# Patient Record
Sex: Female | Born: 1960 | Race: White | Hispanic: No | Smoking: Current every day smoker
Health system: Southern US, Community
[De-identification: ages and names within clinical notes are randomized; demographics above are authoritative.]

## PROBLEM LIST (undated history)

## (undated) DIAGNOSIS — E039 Hypothyroidism, unspecified: Secondary | ICD-10-CM

## (undated) DIAGNOSIS — F319 Bipolar disorder, unspecified: Secondary | ICD-10-CM

## (undated) DIAGNOSIS — F329 Major depressive disorder, single episode, unspecified: Secondary | ICD-10-CM

## (undated) DIAGNOSIS — F32A Depression, unspecified: Secondary | ICD-10-CM

## (undated) DIAGNOSIS — F419 Anxiety disorder, unspecified: Secondary | ICD-10-CM

## (undated) DIAGNOSIS — Z86010 Personal history of colonic polyps: Secondary | ICD-10-CM

## (undated) DIAGNOSIS — I10 Essential (primary) hypertension: Secondary | ICD-10-CM

## (undated) DIAGNOSIS — M1990 Unspecified osteoarthritis, unspecified site: Secondary | ICD-10-CM

## (undated) DIAGNOSIS — E785 Hyperlipidemia, unspecified: Secondary | ICD-10-CM

## (undated) DIAGNOSIS — J449 Chronic obstructive pulmonary disease, unspecified: Secondary | ICD-10-CM

## (undated) DIAGNOSIS — J45909 Unspecified asthma, uncomplicated: Secondary | ICD-10-CM

## (undated) DIAGNOSIS — K219 Gastro-esophageal reflux disease without esophagitis: Secondary | ICD-10-CM

## (undated) HISTORY — DX: Major depressive disorder, single episode, unspecified: F32.9

## (undated) HISTORY — DX: Personal history of colonic polyps: Z86.010

## (undated) HISTORY — DX: Gastro-esophageal reflux disease without esophagitis: K21.9

## (undated) HISTORY — PX: POLYPECTOMY: SHX149

## (undated) HISTORY — DX: Chronic obstructive pulmonary disease, unspecified: J44.9

## (undated) HISTORY — DX: Essential (primary) hypertension: I10

## (undated) HISTORY — DX: Unspecified osteoarthritis, unspecified site: M19.90

## (undated) HISTORY — PX: COLONOSCOPY: SHX174

## (undated) HISTORY — DX: Anxiety disorder, unspecified: F41.9

## (undated) HISTORY — DX: Bipolar disorder, unspecified: F31.9

## (undated) HISTORY — DX: Hyperlipidemia, unspecified: E78.5

## (undated) HISTORY — DX: Depression, unspecified: F32.A

## (undated) HISTORY — DX: Unspecified asthma, uncomplicated: J45.909

## (undated) HISTORY — DX: Hypothyroidism, unspecified: E03.9

---

## 2000-06-08 ENCOUNTER — Encounter: Payer: Self-pay | Admitting: Emergency Medicine

## 2000-06-08 ENCOUNTER — Encounter: Admission: RE | Admit: 2000-06-08 | Discharge: 2000-06-08 | Payer: Self-pay | Admitting: Emergency Medicine

## 2001-06-10 ENCOUNTER — Encounter: Admission: RE | Admit: 2001-06-10 | Discharge: 2001-06-10 | Payer: Self-pay | Admitting: Emergency Medicine

## 2001-06-10 ENCOUNTER — Encounter: Payer: Self-pay | Admitting: Emergency Medicine

## 2001-06-17 ENCOUNTER — Encounter: Payer: Self-pay | Admitting: Emergency Medicine

## 2001-06-17 ENCOUNTER — Encounter: Admission: RE | Admit: 2001-06-17 | Discharge: 2001-06-17 | Payer: Self-pay | Admitting: Emergency Medicine

## 2002-04-11 ENCOUNTER — Encounter: Payer: Self-pay | Admitting: Family Medicine

## 2002-04-11 ENCOUNTER — Encounter: Admission: RE | Admit: 2002-04-11 | Discharge: 2002-04-11 | Payer: Self-pay | Admitting: Family Medicine

## 2002-07-16 ENCOUNTER — Encounter: Admission: RE | Admit: 2002-07-16 | Discharge: 2002-07-16 | Payer: Self-pay

## 2002-08-25 ENCOUNTER — Encounter: Admission: RE | Admit: 2002-08-25 | Discharge: 2002-08-25 | Payer: Self-pay

## 2002-11-11 ENCOUNTER — Inpatient Hospital Stay (HOSPITAL_COMMUNITY): Admission: EM | Admit: 2002-11-11 | Discharge: 2002-11-14 | Payer: Self-pay | Admitting: Psychiatry

## 2002-11-27 HISTORY — PX: LUMBAR FUSION: SHX111

## 2003-07-20 ENCOUNTER — Encounter: Admission: RE | Admit: 2003-07-20 | Discharge: 2003-07-20 | Payer: Self-pay | Admitting: Emergency Medicine

## 2003-07-20 ENCOUNTER — Encounter: Payer: Self-pay | Admitting: Emergency Medicine

## 2003-12-07 ENCOUNTER — Inpatient Hospital Stay (HOSPITAL_COMMUNITY): Admission: RE | Admit: 2003-12-07 | Discharge: 2003-12-09 | Payer: Self-pay | Admitting: Neurosurgery

## 2004-01-07 ENCOUNTER — Encounter: Admission: RE | Admit: 2004-01-07 | Discharge: 2004-01-07 | Payer: Self-pay | Admitting: Neurosurgery

## 2004-03-10 ENCOUNTER — Encounter: Admission: RE | Admit: 2004-03-10 | Discharge: 2004-03-10 | Payer: Self-pay | Admitting: Neurosurgery

## 2005-10-20 ENCOUNTER — Encounter: Admission: RE | Admit: 2005-10-20 | Discharge: 2005-10-20 | Payer: Self-pay | Admitting: Emergency Medicine

## 2005-11-10 ENCOUNTER — Encounter: Admission: RE | Admit: 2005-11-10 | Discharge: 2005-11-10 | Payer: Self-pay | Admitting: Emergency Medicine

## 2006-05-14 ENCOUNTER — Encounter: Admission: RE | Admit: 2006-05-14 | Discharge: 2006-05-14 | Payer: Self-pay | Admitting: Emergency Medicine

## 2006-10-02 ENCOUNTER — Encounter: Admission: RE | Admit: 2006-10-02 | Discharge: 2006-10-02 | Payer: Self-pay | Admitting: Emergency Medicine

## 2006-10-25 ENCOUNTER — Encounter
Admission: RE | Admit: 2006-10-25 | Discharge: 2007-01-23 | Payer: Self-pay | Admitting: Physical Medicine & Rehabilitation

## 2006-10-25 ENCOUNTER — Ambulatory Visit: Payer: Self-pay | Admitting: Physical Medicine & Rehabilitation

## 2006-10-31 ENCOUNTER — Encounter
Admission: RE | Admit: 2006-10-31 | Discharge: 2007-01-16 | Payer: Self-pay | Admitting: Physical Medicine & Rehabilitation

## 2006-11-13 ENCOUNTER — Encounter: Admission: RE | Admit: 2006-11-13 | Discharge: 2006-11-13 | Payer: Self-pay | Admitting: Emergency Medicine

## 2006-11-22 ENCOUNTER — Encounter: Admission: RE | Admit: 2006-11-22 | Discharge: 2006-11-22 | Payer: Self-pay | Admitting: Emergency Medicine

## 2007-01-17 ENCOUNTER — Encounter
Admission: RE | Admit: 2007-01-17 | Discharge: 2007-04-17 | Payer: Self-pay | Admitting: Physical Medicine & Rehabilitation

## 2007-01-17 ENCOUNTER — Ambulatory Visit: Payer: Self-pay | Admitting: Physical Medicine & Rehabilitation

## 2007-03-04 ENCOUNTER — Ambulatory Visit: Payer: Self-pay | Admitting: Physical Medicine & Rehabilitation

## 2007-04-19 ENCOUNTER — Ambulatory Visit: Payer: Self-pay | Admitting: Physical Medicine & Rehabilitation

## 2007-04-19 ENCOUNTER — Encounter
Admission: RE | Admit: 2007-04-19 | Discharge: 2007-07-18 | Payer: Self-pay | Admitting: Physical Medicine & Rehabilitation

## 2007-06-28 ENCOUNTER — Ambulatory Visit: Payer: Self-pay | Admitting: Physical Medicine & Rehabilitation

## 2007-07-25 ENCOUNTER — Encounter
Admission: RE | Admit: 2007-07-25 | Discharge: 2007-10-23 | Payer: Self-pay | Admitting: Physical Medicine & Rehabilitation

## 2007-08-29 ENCOUNTER — Ambulatory Visit: Payer: Self-pay | Admitting: Physical Medicine & Rehabilitation

## 2007-10-21 ENCOUNTER — Ambulatory Visit: Payer: Self-pay | Admitting: Family Medicine

## 2007-10-21 LAB — CONVERTED CEMR LAB: Microalb, Ur: 1.44 mg/dL (ref 0.00–1.89)

## 2007-10-22 ENCOUNTER — Ambulatory Visit: Payer: Self-pay | Admitting: *Deleted

## 2007-10-22 ENCOUNTER — Encounter
Admission: RE | Admit: 2007-10-22 | Discharge: 2007-10-23 | Payer: Self-pay | Admitting: Physical Medicine & Rehabilitation

## 2007-12-03 ENCOUNTER — Ambulatory Visit: Payer: Self-pay | Admitting: Family Medicine

## 2007-12-03 LAB — CONVERTED CEMR LAB
AST: 35 units/L (ref 0–37)
Albumin: 4.2 g/dL (ref 3.5–5.2)
Alkaline Phosphatase: 63 units/L (ref 39–117)
Basophils Relative: 0 % (ref 0–1)
Glucose, Bld: 102 mg/dL — ABNORMAL HIGH (ref 70–99)
LDL Cholesterol: 117 mg/dL — ABNORMAL HIGH (ref 0–99)
MCHC: 31.8 g/dL (ref 30.0–36.0)
Monocytes Relative: 9 % (ref 3–12)
Neutro Abs: 3.6 10*3/uL (ref 1.7–7.7)
Neutrophils Relative %: 46 % (ref 43–77)
Potassium: 3.9 meq/L (ref 3.5–5.3)
RBC: 4.55 M/uL (ref 3.87–5.11)
Sodium: 139 meq/L (ref 135–145)
TSH: 4.455 microintl units/mL (ref 0.350–5.50)
Total Protein: 7.9 g/dL (ref 6.0–8.3)
Triglycerides: 363 mg/dL — ABNORMAL HIGH (ref <150)
WBC: 7.8 10*3/uL (ref 4.0–10.5)

## 2008-02-13 ENCOUNTER — Encounter (INDEPENDENT_AMBULATORY_CARE_PROVIDER_SITE_OTHER): Payer: Self-pay | Admitting: Family Medicine

## 2008-02-13 ENCOUNTER — Ambulatory Visit: Payer: Self-pay | Admitting: Family Medicine

## 2008-03-24 ENCOUNTER — Ambulatory Visit: Payer: Self-pay | Admitting: Internal Medicine

## 2008-05-18 ENCOUNTER — Ambulatory Visit: Payer: Self-pay | Admitting: Internal Medicine

## 2008-05-18 ENCOUNTER — Encounter (INDEPENDENT_AMBULATORY_CARE_PROVIDER_SITE_OTHER): Payer: Self-pay | Admitting: Family Medicine

## 2008-05-18 LAB — CONVERTED CEMR LAB
Cholesterol: 193 mg/dL (ref 0–200)
HDL: 40 mg/dL (ref >39)
Potassium: 4.2 meq/L (ref 3.5–5.3)
Sodium: 143 meq/L (ref 135–145)
Total CHOL/HDL Ratio: 4.8
Triglycerides: 275 mg/dL — ABNORMAL HIGH (ref <150)
VLDL: 55 mg/dL — ABNORMAL HIGH (ref 0–40)

## 2008-06-22 ENCOUNTER — Ambulatory Visit: Payer: Self-pay | Admitting: Internal Medicine

## 2008-06-22 ENCOUNTER — Encounter: Payer: Self-pay | Admitting: Internal Medicine

## 2008-06-22 LAB — CONVERTED CEMR LAB
Alkaline Phosphatase: 58 units/L (ref 39–117)
BUN: 10 mg/dL (ref 6–23)
Glucose, Bld: 207 mg/dL — ABNORMAL HIGH (ref 70–99)
Total Bilirubin: 0.3 mg/dL (ref 0.3–1.2)

## 2008-08-21 ENCOUNTER — Ambulatory Visit: Payer: Self-pay | Admitting: Family Medicine

## 2008-08-31 ENCOUNTER — Encounter (INDEPENDENT_AMBULATORY_CARE_PROVIDER_SITE_OTHER): Payer: Self-pay | Admitting: Family Medicine

## 2008-08-31 ENCOUNTER — Ambulatory Visit: Payer: Self-pay | Admitting: Internal Medicine

## 2008-08-31 LAB — CONVERTED CEMR LAB
Eosinophils Relative: 2 % (ref 0–5)
Free T4: 0.97 ng/dL (ref 0.89–1.80)
HCT: 46.2 % — ABNORMAL HIGH (ref 36.0–46.0)
Hemoglobin: 15.2 g/dL — ABNORMAL HIGH (ref 12.0–15.0)
Lymphocytes Relative: 42 % (ref 12–46)
Lymphs Abs: 3.2 10*3/uL (ref 0.7–4.0)
Neutro Abs: 3.5 10*3/uL (ref 1.7–7.7)
Platelets: 249 10*3/uL (ref 150–400)
Vit D, 1,25-Dihydroxy: 31 (ref 30–89)
Vitamin B-12: 375 pg/mL (ref 211–911)
WBC: 7.6 10*3/uL (ref 4.0–10.5)

## 2008-11-17 ENCOUNTER — Ambulatory Visit: Payer: Self-pay | Admitting: Family Medicine

## 2008-11-17 LAB — CONVERTED CEMR LAB: Microalb, Ur: 135 mg/dL — ABNORMAL HIGH (ref 0.00–1.89)

## 2008-11-25 ENCOUNTER — Ambulatory Visit: Payer: Self-pay | Admitting: Internal Medicine

## 2008-12-08 ENCOUNTER — Ambulatory Visit: Payer: Self-pay | Admitting: Internal Medicine

## 2008-12-18 ENCOUNTER — Ambulatory Visit: Payer: Self-pay | Admitting: Family Medicine

## 2009-02-18 ENCOUNTER — Ambulatory Visit: Payer: Self-pay | Admitting: Family Medicine

## 2009-02-18 LAB — CONVERTED CEMR LAB
BUN: 14 mg/dL (ref 6–23)
Basophils Absolute: 0 10*3/uL (ref 0.0–0.1)
Basophils Relative: 1 % (ref 0–1)
CO2: 23 meq/L (ref 19–32)
Calcium: 9.5 mg/dL (ref 8.4–10.5)
Chloride: 102 meq/L (ref 96–112)
Cholesterol: 206 mg/dL — ABNORMAL HIGH (ref 0–200)
Creatinine, Ser: 0.98 mg/dL (ref 0.40–1.20)
Eosinophils Absolute: 0.2 10*3/uL (ref 0.0–0.7)
Eosinophils Relative: 3 % (ref 0–5)
Glucose, Bld: 98 mg/dL (ref 70–99)
HCT: 43.2 % (ref 36.0–46.0)
HDL: 32 mg/dL — ABNORMAL LOW (ref >39)
Hemoglobin: 14.4 g/dL (ref 12.0–15.0)
LDL Cholesterol: 104 mg/dL — ABNORMAL HIGH (ref 0–99)
Lymphocytes Relative: 42 % (ref 12–46)
Lymphs Abs: 3 10*3/uL (ref 0.7–4.0)
MCHC: 33.3 g/dL (ref 30.0–36.0)
MCV: 102.4 fL — ABNORMAL HIGH (ref 78.0–100.0)
Monocytes Absolute: 0.7 10*3/uL (ref 0.1–1.0)
Monocytes Relative: 10 % (ref 3–12)
Neutro Abs: 3.2 10*3/uL (ref 1.7–7.7)
Neutrophils Relative %: 44 % (ref 43–77)
Platelets: 212 10*3/uL (ref 150–400)
Potassium: 4 meq/L (ref 3.5–5.3)
RBC: 4.22 M/uL (ref 3.87–5.11)
RDW: 15.9 % — ABNORMAL HIGH (ref 11.5–15.5)
Sodium: 141 meq/L (ref 135–145)
TSH: 5.32 microintl units/mL — ABNORMAL HIGH (ref 0.350–4.500)
Total CHOL/HDL Ratio: 6.4
Triglycerides: 351 mg/dL — ABNORMAL HIGH (ref <150)
VLDL: 70 mg/dL — ABNORMAL HIGH (ref 0–40)
Valproic Acid Lvl: 106.3 ug/mL — ABNORMAL HIGH (ref 50.0–100.0)
WBC: 7.2 10*3/uL (ref 4.0–10.5)

## 2009-05-27 ENCOUNTER — Ambulatory Visit: Payer: Self-pay | Admitting: Internal Medicine

## 2009-10-29 ENCOUNTER — Ambulatory Visit (HOSPITAL_COMMUNITY): Admission: RE | Admit: 2009-10-29 | Discharge: 2009-10-29 | Payer: Self-pay | Admitting: Family Medicine

## 2009-10-29 ENCOUNTER — Ambulatory Visit: Payer: Self-pay | Admitting: Family Medicine

## 2010-04-05 ENCOUNTER — Encounter (INDEPENDENT_AMBULATORY_CARE_PROVIDER_SITE_OTHER): Payer: Self-pay | Admitting: Family Medicine

## 2010-04-05 ENCOUNTER — Ambulatory Visit: Payer: Self-pay | Admitting: Internal Medicine

## 2010-04-05 LAB — CONVERTED CEMR LAB: TSH: 2.618 microintl units/mL (ref 0.350–4.500)

## 2010-05-31 ENCOUNTER — Ambulatory Visit: Payer: Self-pay | Admitting: Family Medicine

## 2010-05-31 LAB — CONVERTED CEMR LAB
Alkaline Phosphatase: 70 units/L (ref 39–117)
Bilirubin, Direct: 0.1 mg/dL (ref 0.0–0.3)
Eosinophils Absolute: 0.2 10*3/uL (ref 0.0–0.7)
Eosinophils Relative: 2 % (ref 0–5)
HCT: 42.5 % (ref 36.0–46.0)
Indirect Bilirubin: 0.2 mg/dL (ref 0.0–0.9)
Lymphocytes Relative: 37 % (ref 12–46)
Lymphs Abs: 3.2 10*3/uL (ref 0.7–4.0)
MCV: 97.5 fL (ref 78.0–100.0)
Monocytes Relative: 8 % (ref 3–12)
Platelets: 259 10*3/uL (ref 150–400)
RBC: 4.36 M/uL (ref 3.87–5.11)
Total Protein: 7.5 g/dL (ref 6.0–8.3)
WBC: 8.7 10*3/uL (ref 4.0–10.5)

## 2010-08-30 ENCOUNTER — Emergency Department (HOSPITAL_COMMUNITY): Admission: EM | Admit: 2010-08-30 | Discharge: 2010-08-30 | Payer: Self-pay | Admitting: Emergency Medicine

## 2010-09-27 ENCOUNTER — Encounter (INDEPENDENT_AMBULATORY_CARE_PROVIDER_SITE_OTHER): Payer: Self-pay | Admitting: Family Medicine

## 2010-09-27 LAB — CONVERTED CEMR LAB
BUN: 11 mg/dL (ref 6–23)
CO2: 24 meq/L (ref 19–32)
Free T4: 0.89 ng/dL (ref 0.80–1.80)
Glucose, Bld: 214 mg/dL — ABNORMAL HIGH (ref 70–99)
Hgb A1c MFr Bld: 8.4 % — ABNORMAL HIGH (ref <5.7)
Potassium: 4 meq/L (ref 3.5–5.3)
Sodium: 137 meq/L (ref 135–145)
TSH: 8.691 microintl units/mL — ABNORMAL HIGH (ref 0.350–4.500)
Total CHOL/HDL Ratio: 7.9

## 2010-11-22 ENCOUNTER — Encounter (INDEPENDENT_AMBULATORY_CARE_PROVIDER_SITE_OTHER): Payer: Self-pay | Admitting: Family Medicine

## 2010-11-22 LAB — CONVERTED CEMR LAB: ALT: 27 units/L (ref 0–35)

## 2010-12-29 ENCOUNTER — Encounter (INDEPENDENT_AMBULATORY_CARE_PROVIDER_SITE_OTHER): Payer: Self-pay | Admitting: Family Medicine

## 2010-12-29 LAB — CONVERTED CEMR LAB
Free T4: 1.04 ng/dL (ref 0.80–1.80)
TSH: 13.517 microintl units/mL — ABNORMAL HIGH (ref 0.350–4.500)

## 2011-02-08 LAB — DIFFERENTIAL
Basophils Absolute: 0 10*3/uL (ref 0.0–0.1)
Eosinophils Relative: 3 % (ref 0–5)
Lymphocytes Relative: 40 % (ref 12–46)
Neutrophils Relative %: 49 % (ref 43–77)

## 2011-02-08 LAB — BASIC METABOLIC PANEL
BUN: 15 mg/dL (ref 6–23)
Calcium: 9.4 mg/dL (ref 8.4–10.5)
Chloride: 95 mEq/L — ABNORMAL LOW (ref 96–112)
Creatinine, Ser: 1.01 mg/dL (ref 0.4–1.2)
GFR calc Af Amer: >60 mL/min (ref >60)
GFR calc non Af Amer: 58 mL/min — ABNORMAL LOW (ref >60)

## 2011-02-08 LAB — POCT CARDIAC MARKERS
Myoglobin, poc: 105 ng/mL (ref 12–200)
Troponin i, poc: <0.05 ng/mL (ref 0.00–0.09)
Troponin i, poc: <0.05 ng/mL (ref 0.00–0.09)

## 2011-02-08 LAB — CBC
MCV: 99 fL (ref 78.0–100.0)
Platelets: 208 10*3/uL (ref 150–400)
RBC: 3.9 MIL/uL (ref 3.87–5.11)
RDW: 14.7 % (ref 11.5–15.5)
WBC: 9 10*3/uL (ref 4.0–10.5)

## 2011-04-11 NOTE — Assessment & Plan Note (Signed)
Becky Camacho is back regarding her low back pain.  I released her to work last  week, much per her request more than my feeling that she could handle  the work.  She lasted 17 hours and the pain returned.  She has been off  for the long weekend and feels better since resting.  Her pain is down  to a 4-5/10 related to the low back.  The pain is described as sharp and  sometimes stabbing.  Pain interferes with general activity, relations  with others, and enjoyment of life on a mild-to-moderate level.   REVIEW OF SYSTEMS:  The patient reports some depression and anxiety  particularly after failing at her return to the job.   SOCIAL HISTORY:  The patient is widowed and without change.   PHYSICAL EXAMINATION:  VITAL SIGNS:  Blood pressure is 128/78, pulse 95,  respiratory rate 17.  She is satting 94% on room air.  GENERAL:  The patient is generally pleasant.  Alert and oriented times  three.  Affect is bright and appropriate.  She remains overweight.  MUSCULOSKELETAL:  Coordination is intact.  Motor function and back exam  essentially are unchanged.  NEURO:  Cognitively, she is within normal limits.  HEART:  Regular.  CHEST:  Clear.  ABDOMEN:  Soft and nontender.   ASSESSMENT:  1. Chronic low back pain associated with degenerative lumbar disk      disease and post laminectomy syndrome.  2. Lumbar facet syndrome.  3. Obesity.  4. Bipolar disorder.   PLAN:  1. 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  She needs to see if they offer any type of      long term disability.  She needs to discuss potential vocational      rehab with them.  I think she is appropriate for sedentary duty.  2. Continue oxycodone for pain as well as regular stretching and      exercise.  She will continue with Mobic for any inflammatory      effects.  3. Can consider further lumbar facet blocks depending on her course,      but she seems to do well once she is  away from her job.  4. I will see her back in three months' time.  I asked her to call me      if she needs any letters or statements determining her ability to      work.      Ranelle Oyster, M.D.  Electronically Signed     ZTS/MedQ  D:  04/23/2007 10:05:58  T:  04/23/2007 10:55:55  Job #:  [id number]   cc:   Oley Balm. Georgina Pillion, M.D.  Fax: [phone]

## 2011-04-11 NOTE — Assessment & Plan Note (Signed)
Becky Camacho is back for her low back pain.  She has been doing quite well  since I last saw her. He pain has been ranging from 1/10 to 2/10.  The  pain occasionally is in the right lower back.  The pain increases with  activities, particularly standing and walking for longer distances.  She  states the pain interferes with her generally activity,  relationships  with others, and enjoyment of life on a mild to moderate level.  She  hopes to get back to work.   The patient remains on oxycodone 5 mg 1 daily to twice a day p.r.n.Marland Kitchen  She uses Mobic daily for pain control.  She does some stretching and  regular walking.  She likes to use stretching, heat, and ice for her  back for pain relief.   REVIEW OF SYSTEMS:  Is notable for occasionally high sugars.  She has  some depression and anxiety but, otherwise, stable.  Other pertinents  and positives listed above.   SOCIAL HISTORY:  The patient is widowed, smokes one pack per day of  cigarettes.  She has been working as a Oncologist previously.   PHYSICAL EXAMINATION:  Blood pressure is 141/71, pulse is 94,  respiratory rate 17.  She is satting 94% on room air.  The patient is  pleasant, in no acute distress.  She is alert and oriented times three.  Affect is bright and appropriate.  The patient's back is minimally  tender to palpation in the lumbar spine.  She has some pain with right  sided facet  maneuvers.  She is able to bend total of 90 degrees with  really no pain today.  She had some tightness in her hamstrings with  straight leg raising today.  Compression test was negative.  Patrick's  test was negative.  Post laminectomy scars noted.  Cognitively, she is  appropriate.  She still remains overweight.  Mood is good.  Heart is  regular.  Chest is clear.  Abdomen is soft, nontender.   ASSESSMENT:  1. Lumbar post laminectomy syndrome.  2. Lumbar facet syndrome L4- L5, and L5-S1, right greater than left.  3. Obesity.  4. Bipolar disorder.   PLAN:  1. Continue with home exercise and stretching program.  2. I release her to go back to work as tolerated.  She needs to be      diligent with her stretching and technique on the job.  She is not      going to be lifting more 9 or 10 pounds, but she lifts frequently      and turns frequently.  She is to sit as much as possible.  I think      a stretch break every 30 minutes to an hour would be very      beneficial for her.  3. Continue Mobic daily.  4. I will see her back in about 3 months time.  She will come back for      refills in about 4 to 6 weeks, depending on use of oxycodone.      Ranelle Oyster, M.D.  Electronically Signed     ZTS/MedQ  D:  04/08/2007 09:47:01  T:  04/08/2007 10:41:06  Job #:  [id number]   cc:   Oley Balm. Georgina Pillion, M.D.  Fax: [phone]

## 2011-04-11 NOTE — Assessment & Plan Note (Signed)
Becky Camacho is back regarding her low back pain. She has been having some  problems as of late with her back. Her neck has been more stiff as well.  She does not recall any exacerbating incident. She did lose her  prescription for her oxycodone, as it [place] of the [place] of [place]  [place]. She rates her pain a 5 to 8 out of 10. She describes it as  aching. Her pain is worse with walking, bending, standing, and general  activities. Her pain interferes with her function and quality of life on  a moderate to severe level. She can walk about 20 minutes without having  to stop.   REVIEW OF SYSTEMS:  Notable for depression and anxiety. Other pertinent  positives are listed above.   SOCIAL HISTORY:  The patient is widowed. She lives with family. She quit  her job and is receiving no benefits through her work, although she  worked there for 25+ years.   PHYSICAL EXAMINATION:  VITAL SIGNS:  Blood pressure 123/68, pulse 95,  respiratory rate 18. She is saturating 95% on room air.  GENERAL:  The patient is pleasant. She is alert and oriented x3. Her  affect is bright and appropriate. Her gait is normal for basic movement.  She had some problems with fine motor and heel to toe movement.  NECK:  Tender with palpation, particularly over the upper trapezius  segments bilaterally with spastic muscle appreciate. She had limited  flexion, extension, and rotation today. Compression test was equivocal,  as was facet maneuvers today.  LOW BACK:  Remained tender to extension and rotation to either side. She  had limited flexion of approximately 20 to 30 degrees before pain set  in. She remains morbidly obese.  HEART:  Regular.  CHEST:  Clear.  ABDOMEN:  Soft and nontender.   ASSESSMENT:  1. Chronic low back pain related to degenerative lumbar disc disease      and post-laminectomy syndrome.  2. Lumbar facet syndrome.  3. Obesity.  4. Bipolar disorder.  5. Cervical myofascial pain.   PLAN:  1.  After informed consent, we injected both upper trapezius areas with      2 cc of 1% Lidocaine. The patient tolerated this well and was      feeling better as she was leaving the office.  2. Continue oxycodone for breakthrough pain. 5 mg, 1 every 8 hours      p.r.n. I wrote her another prescription for November.  3. I gave her samples of Skelaxin for muscle relaxation.  4. I want her to work on physical fitness, Pilates exercises, diet,      etcetera.  5. I will see her back in about four months time. She will see the      nurse clinic back in two months. Consider facet injections in the      future, as she did well with these before.      Ranelle Oyster, M.D.  Electronically Signed     ZTS/MedQ  D:  08/30/2007 15:35:40  T:  08/31/2007 05:59:16  Job #:  [id number]   cc:   Oley Balm. Georgina Pillion, M.D.  Fax: [phone]

## 2011-04-14 NOTE — Op Note (Signed)
NAME:  Becky Camacho, Becky Camacho                          ACCOUNT NO.:  [id number]   MEDICAL RECORD NO.:  [id number]                   PATIENT TYPE:  INP   LOCATION:  3172                                 FACILITY:  [facility]   PHYSICIAN:  Kathaleen Maser. Pool, M.D.                 DATE OF BIRTH:  15-Dec-1960   DATE OF PROCEDURE:  12/07/2003  DATE OF DISCHARGE:                                 OPERATIVE REPORT   PREOPERATIVE DIAGNOSES:  L2-3 degenerative disk disease with instability and  stenosis.   POSTOPERATIVE DIAGNOSES:  L2-3 degenerative disk disease with instability  and stenosis.   OPERATION PERFORMED:  L2-3 decompressive laminectomy with foraminotomies.  L2-3 posterior lumbar interbody fusion utilizing tangent wedges and local  autografting. L2-3 posterolateral fusion utilizing pedicle screw  instrumentation and local autografting.   SURGEON:  Kathaleen Maser. Pool, M.D.   ASSISTANT:  Donalee Citrin, M.D.   ANESTHESIA:  General endotracheal.   INDICATIONS FOR PROCEDURE:  Ms. Pavlak is a 50 year old female with a history  of severe back pain and bilateral lower extremity symptoms failing  conservative management.  Work-up has demonstrated evidence of an anterior  wedge fracture of L3 with some degree of retrolisthesis of L2 on L3 and  evidence of dynamic instability with foraminal collapse and stenosis  bilaterally.  The patient presents now for L2-3 decompression and fusion  with instrumentation. The remainder of her lumbar spine looks quite healthy.   DESCRIPTION OF PROCEDURE:  The patient was taken to the operating room and  placed on the table in the supine position.  After adequate level of  anesthesia was achieved, the patient was positioned prone onto a Wilson  frame and appropriately padded.  The patient's lumbar region was prepped and  draped sterilely.  A 10 blade was used to make a linear skin incision  overlying the L1, 2, and 3 levels.  This was carried down sharply in the  midline.  A  subperiosteal dissection was then performed exposing the lamina  and facet joints of L1, L2 and L3 as well as the transverse processes of L2  and L3.  Deep self-retaining retractor was placed.  Intraoperative  fluoroscopy was used and the levels were confirmed.  Complete laminectomy  was then performed at L2 and using Leksell rongeur, Kerrison rongeurs  and a  high speed drill.  All elements of the lamina were completely removed to the  inferior aspect of L1.  Inferior facetectomies were performed bilaterally at  L2, superior facetectomies were performed bilaterally at L3 and partial  superior laminectomy of L3 was performed bilaterally.  All bone was cleaned  and used in later autografting.  The ligamentum flavum was again elevated  and resected in piecemeal fashion using Kerrison rongeurs carried down to  the thecal sac, the exiting L2 and L3 nerve roots were identified.  Wide  foraminotomies were performed along their course fully relieving the  stenosis.  The epidural venous plexus was coagulated and cut.  Starting  first on the right side at L2-3, the thecal sac and nerve roots were  protected.  The disk space was identified and incised with a 15 blade in  rectangular fashion.  A wide disk space clean out was then achieved using  pituitary rongeurs, upward angled pituitary rongeurs and Epstein curets.  The disk space was then dilated up to 10 mm and a 10 mm distractor was left  in the patient's right side.  Attention was then placed to the left.  The  thecal sac and nerve roots were protected on the left.  Disk space once  again incised with 15 blade in rectangular fashion.  An aggressive  diskectomy was performed on the left side.  Subsequently, a 10 mm tangent  box cutter and 10 mm tangent chisel was used to further prepare the  interspace.  Soft tissue was removed from the interspace.  A 10 x 26 mm  tangent wedge was then impacted into place and recessed approximately 1 mm  from the  posterior cortical margin.  Distractor was removed from the  patient's contralateral side.  Thecal sac and nerve roots were protected on  the contralateral side and the disk space was once again reamed and then cut  with 10 mm tangent instruments.  The disk space was further curettaged.  Morselized autograft was packed in the interspace.  A second 10 x 26 mm  tangent wedge was then impacted into place and recessed approximately 1 mm  from the posterior cortical margin.  Pedicles at L2 and L3 were then  identified using surface landmarks, intraoperative fluoroscopy.  Superficial  bone overlying the pedicles was removed using the high speed drill.  Each  pedicle was then probed using pedicle awl.  Each pedicle awl track was then  probed and found to be solidly within bone.  Each pedicle awl track was then  tapped with a 5.25 mm screw tap.  Each screw tap hole was probed and found  to be solidly within bone.  5.75 x 40 mm spiral 90 D screws were placed  bilaterally at L2 and L3.  All four screws were found to be well positioned  by both direct visual inspection and intraoperative fluoroscopy.  Transverse  processes of L2 and L3 were then decorticated using high speed drill.  The  morselized autograft was packed posterolaterally bilaterally.  A short  segment titanium rod was then contoured and placed over the screw heads at  L2 and L3.  This was then attached to the screws using locking caps.  The  locking caps were then engaged in a sequential fashion with the construct  under compression.  Final images revealed good position of bone graft and  hardware at the proper operative level with normal alignment of the spine.  The wound was then irrigated with antibiotic solution.  Gelfoam was placed  topically for hemostasis which was found to be good.  A medium Hemovac drain  was left in the epidural space.  The wound was then closed in layers with Vicryl sutures.  Steri-Strips and sterile dressing  were applied.  There were  no apparent complications.  The patient tolerated the procedure well and  returned to the recovery room postoperatively.  Henry A. Pool, M.D.    HAP/MEDQ  D:  12/07/2003  T:  12/07/2003  Job:  [id number]

## 2011-04-14 NOTE — Assessment & Plan Note (Signed)
Becky Camacho is back regarding her low back pain.  She feels like she is  gradually improving in regards to her back pain.  She still has pain  after she walks for about 20 minutes in the right central low back just  above the iliac crest.  Physical therapy was helpful.  She still does  not feel like she is ready to go back to work but still has aspirations  returning to her job.  She rates her pain as a 5/10 and describes it as  aching.  Pain interferes with general activity, relations with others  and enjoyment of life on a moderate level.  Pain is usually worse with  walking and standing and relieved somewhat with rest and some times  flexing.  She uses oxycodone for break though pain as well as her Mobic.   REVIEW OF SYSTEMS:  Notable for the above.  Full review is in the health  and history section of the chart.   SOCIAL HISTORY:  The patient is widowed and without significant change.  She works as a Oncologist.   PHYSICAL EXAMINATION:  VITAL SIGNS:  Blood pressure is 154/90, pulse is  107, respiratory rate 16, she is sating 95% on room air.  GENERAL:  The patient is pleasant and in no acute distress.  She is  alert and oriented x3.  Affect is bright and appropriate.  She remains  obese.  Gait is stable.  Coordination was intact.  Reflexes were2+.  Motor function is normal.  She is able to bend to about 90 degrees with  minimal pain.  When we extended her she had some discomfort but more  pain particularly with movements to the right and right-sided facet  maneuvers.  Left-sided symptoms were much less pronounced today.  Compression test was negative.  Patrick's test was negative as was  straight leg testing.  Post laminectomy wound was noted.  Cognitively  she is within normal limits.  Mood was good.   ASSESSMENT:  1. Chronic low back pain with history of lumbar disc disease and L2-L3      discectomy and fusion with laminectomy.  2. Lumbar facet syndrome at L4-L5 possibly L5-S1 right  greater than      left.  3. Obesity.  4. Bipolar disorder.   PLAN:  1. Continue with home exercise program.  The patient needs to focus on      weight loss and further exercise.  2. Refilled oxycodone 5 mg #60.  3. Continue Mobic.  4. Consider facet blocks at L4-L5 and/or L5-S1.  5. Filled out paperwork for work.  We signed her out for 3 months.  I      am still not sure if it is a realistic option for her to return to      her job although she would like to if she can.      Ranelle Oyster, M.D.  Electronically Signed     ZTS/MedQ  D:  01/18/2007 11:35:14  T:  01/18/2007 12:45:23  Job #:  [id number]   cc:   Hayden Rasmussen  Fax: [phone]

## 2011-04-14 NOTE — Discharge Summary (Signed)
NAME:  Becky Camacho, Becky Camacho                          ACCOUNT NO.:  [id number]   MEDICAL RECORD NO.:  [id number]                   PATIENT TYPE:  IPS   LOCATION:  0503                                 FACILITY:  [facility]   PHYSICIAN:  Carolanne Grumbling, M.D.                 DATE OF BIRTH:  1961-02-11   DATE OF ADMISSION:  11/11/2002  DATE OF DISCHARGE:  11/14/2002                                 DISCHARGE SUMMARY   IDENTIFYING INFORMATION:  The patient was a 50 year old female.   INITIAL ASSESSMENT AND DIAGNOSIS:  The patient was admitted to the hospital  because she had been having mood lability with paranoia and thought  disorganization.  She said she had been depressed for about eight years.  Her husband had died in July 28, 2023 of this year and she said since that time,  she had been having trouble pulling things together.  She states she also  had a brother who committed suicide in Apr 27, 1999 and a second brother who died in  04-27-1999.  By the time of admission, she was sleeping more than usual, being  withdrawn, had gained 30 pounds in six months, thoughts were racing.  She  was talking fast and not able to organize her thoughts.  She had some  suicidal thoughts the weekend prior to Thanksgiving but by the time of this  admission, she was denying any suicidal thoughts.   MENTAL STATUS EXAM:  Mental status at the time of the initial evaluation  revealed a woman with a labile affect and pressured speech.  She was  intrusive.  She acted childlike at times in mannerisms and voice.  Speech  was rapid and mildly pressured.  She did express some paranoid thinking.  She seemed somewhat guarded, particularly around other people and talking  about she handled them.  She had flight of ideas and tangentiality.  She was  oriented x 3.  She recognized that her moods were out of control.  Intelligence seemed to be at least average.  Judgment was impaired.   ADMISSION DIAGNOSES:   AXIS I:  1. Major depressive disorder,  recurrent, severe with psychosis.  2. Rule out bipolar I disorder, mixed state.   AXIS II:  Deferred.   AXIS III:  1. Hypertension.  2. Hypothyroidism.   AXIS IV:  Moderate to severe.   AXIS V:  16/59   FINDINGS:  All indicated laboratory examinations were within normal limits  except for a very slightly elevated T3 uptake, which was reported as 38.9  with a normal high being 37.   HOSPITAL COURSE:  While in the hospital, the patient was basically  talkative.  She did attend groups.  She did believe that she got some help  in talking about her issues.  She also believed that the medications were  the major help to herself because she found herself calming down fairly  quickly.  She  remained fairly labile up until the day before discharge.  By  the day of discharge, she was more calm, moods were more stable.  She  consistently denied any suicidal thoughts and did not want to be in the  hospital.  She was, by the time of discharge, rational.  She was not longer  acting childlike, though she would remain fairly histrionic and dramatic.  She wanted to live with her mother.  Her mother was agreeable to having her  home and consequently, she was discharged to her mother's home.   DISCHARGE MEDICATIONS:  1. Diovan 80 mg daily.  2. Hydrochlorothiazide 25 mg daily.  3. Synthroid 100 mcg daily.  4. Trazodone 50 mg at bedtime as needed.  5. Flovent two puffs daily.  6. Alprazolam 0.25 mg twice daily as needed.  7. Risperdal 0.5 mg at bedtime.  8. Lexapro 20 mg daily.   DISCHARGE INSTRUCTIONS:  There were no restrictions placed on her diet or  her activity.   POST HOSPITAL CARE PLAN:  She was to follow up with Milford Cage, M.D.,  with an appointment on December 23.   FINAL DIAGNOSES:   AXIS I:  Bipolar disorder, not otherwise specified.   AXIS II:  Deferred.   AXIS III:  1. Hypertension.  2. Hypothyroidism.   AXIS IV:  Moderate to severe.   AXIS V:  53/66                                                Carolanne Grumbling, M.D.    GT/MEDQ  D:  11/24/2002  T:  11/24/2002  Job:  [id number]

## 2011-04-14 NOTE — H&P (Signed)
NAME:  Becky Camacho, Becky Camacho NO.:  [id number]   MEDICAL RECORD NO.:  [id number]                   PATIENT TYPE:  IPS   LOCATION:  0503                                 FACILITY:  [facility]   PHYSICIAN:  Geoffery Lyons, M.D.                   DATE OF BIRTH:  1961-01-20   DATE OF ADMISSION:  11/11/2002  DATE OF DISCHARGE:                         PSYCHIATRIC ADMISSION ASSESSMENT   IDENTIFYING INFORMATION:  This is a 50 year old white female who is widowed,  voluntary admission.   HISTORY OF PRESENT ILLNESS:  This patient was referred by her psychiatrist  after she presented in the office with mood lability, paranoia, and thought  disorganization.  The patient reports that she has been fighting depression  for 8 years or so.  The patient had been previously managed by her primary  care physician who had treated her with Prozac for many years.  He switched  her to Lexapro about the time her husband was diagnosed with cancer in  12-Jul-2023.  The patient's husband died somewhat suddenly within 3 weeks of  being diagnosed with cancer.  The patient reports that she has been  struggling with depression since his death and most recently had Xanax 0.5  mg 1/2 to 1 tab b.i.d. added to her medications.  The patient reports that  in addition to her husband dying suddenly in 2023/07/12, she had a brother who  committed suicide in 04/11/1999 and a second brother who also died in 11-Apr-1999.  The  patient reports herself being hypersomic and withdrawn, not wanting to do  anything but lie in bed and sleep.  She has had a 30 pound weight loss in  the past 6 months, with poor appetite, finds that her thoughts are racing,  and finds that she realizes that is frequently talking fast and unable to  organize her thoughts.  She recognizes somewhat that she is hyperverbal and  is unable to stop doing this.  Her family had contacted the patient's  physician and complained of her sleeping a lot and not tending to  her  activities of daily living.  Her psychiatrist yesterday noted that she also  had some regression of her behavior.  The patient reports suicidal thoughts  coming to her intermittently several times a week since the weekend before  Thanksgiving, at which time she attempted to drown herself by walking into  the ocean.  She turned around and walked back out of the water and never  completed the attempt.  Today the patient does endorse some suicidal  thoughts, no homicidal ideation.  She endorses depressed mood, poor appetite  and difficulty sleeping at night, although she spends much time sleeping and  withdrawn during the day.   PAST PSYCHIATRIC HISTORY:  The patient is followed by Dr. Milford Cage,  last seen on yesterday and on December 5.  She has been following her for  the past 1-2 months.  This is her first inpatient psychiatric admission.  She has no prior inpatient admissions.  She was previously treated by her  primary care physician, who is Dr. Georgina Pillion.  The patient has a history of one  prior suicide attempt in November of this same year, referenced above, for  which she was not treated.   SOCIAL HISTORY:  This is a 50 year old white female who is employed at [employer] at the [employer].  She has been unable to work.  She was married  22 years until her husband died on 08/17/243 weeks after being diagnosed  with cancer.  The patient is currently unemployed.  She has also had an  acute episode of bronchitis approximately 1 month ago which she considers to  be an additional stressor.   FAMILY HISTORY:  Remarkable for a brother of the patient who died by  suicide.   ALCOHOL AND DRUG HISTORY:  The patient denies any history of substance  abuse.   PAST MEDICAL HISTORY:  The patient is followed by Dr. Georgina Pillion.  Medical  problems are hypertension and hypothyroidism.   MEDICATIONS:  Diovan 80/12.5 mg p.o. daily, Xanax 0.5 mg 1/2 to 1 tab  b.i.d., hydrochlorothiazide  25 mg p.o. q.d., Lexapro recently increased to  25 mg on October 31, 2002, Synthroid 100 mcg.   DRUG ALLERGIES:  PENICILLIN, which causes a rash, and CODEINE, which she  states has caused her to hallucinate and she reports makes her rage.   POSITIVE PHYSICAL FINDINGS:  The patient's physical examination was quite  limited today due to her intrusive behavior and her labile affect.  She is  an overweight female who is in no acute distress.  Vital signs on admission  to the unit:  Temperature 98.1, pulse 77, respirations 16, blood pressure  126/83.  She weighed 188 pounds and is 5 feet 2 inches tall.  No  particularly remarkable features to her skin.  Her grooming is satisfactory.  CARDIOVASCULAR:  Heart sounds normal with regular rate and rhythm.  S1 and  S2 heard, no clicks, murmurs, or gallops.  No evidence of pedal edema.  Extremities are pink and warm.  Distal pulses are 2+/5, synchronous with  radial pulse.  LUNGS:  Clear to auscultation.  NEURO:  Generally unremarkable.  Her EOMs are intact.  Ocular tracking is  normal.  Facial symmetry is present.  Motor movements are smooth without  tremor.  Her gait is grossly normal, with normal arm swing.   LABORATORY DATA:  The patient's labs are currently pending.   MENTAL STATUS EXAM:  This is an overweight middle-aged female with a labile  affect and pressured speech. She is intrusive and suddenly puts her face up  within 1 inch of mine.  She intermittently regresses to a child-like state  with affected mannerisms and a child-like voice.  Her speech is rapid in  nature and mildly pressured.  Her mood is depressed but labile.  Thought  process is remarkable for some paranoia.  She feels convinced that her  psychiatrist is against her and is somewhat unreasonable in her anger and  annoyance that her psychiatrist would refer her for admission.  She has been somewhat guarded around the other patients and had difficulty tolerating  group  earlier today.  She is also remarkable for flight of ideas and  tangentiality.  Cognitively, she is intact and oriented x3.  The patient has  adequate insight to recognize  that her mood is somewhat out of control and  that she is hyperverbal and so she has some insight into her symptoms.  Her  intelligence is above average, insight fairly good, judgment impaired,  impulse control questionable.   ADMISSION DIAGNOSIS:   AXIS I:  1. Major depressive disorder, recurrent, severe, with psychosis.  2. Rule out bipolar I disorder, mixed state.   AXIS II:  Deferred.   AXIS III:  Hypertension, hypothyroidism.   AXIS IV:  Moderate to severe, grief over death of spouse.   AXIS V:  Current 16, past year 11.   INITIAL PLAN OF CARE:  Voluntarily admit the patient with q.15 minute checks  in place.  Our goal is to alleviate her mood lability.  We will start by  decreasing her Lexapro to 20 mg p.o. daily.  The patient does feel that her  symptoms had become somewhat worse since her Lexapro was increased to 25 mg  a day.  We are going to start her on Risperdal 0.25 mg p.o. b.i.d. and 0.5  mg at h.s. to alleviate her paranoia and her psychotic symptoms, and  hopefully to impact her regressive behavior.  We have elected to avoid  dosing Zyprexa at this time because of her history of weight gain and we  will stay away from Seroquel because of her prior concerns about being  hypersomic during the day.  Meanwhile, we will continue her other routine  medications.  We have discussed the risks and benefits of this program and  advised her about any potential side effects and she is agreeable to it.   ESTIMATED LENGTH OF STAY:  Five days.  \     Margaret A. Scott, N.P.                   Geoffery Lyons, M.D.    MAS/MEDQ  D:  11/13/2002  T:  11/13/2002  Job:  [id number]

## 2011-04-14 NOTE — Assessment & Plan Note (Signed)
Becky Camacho is back regarding her low back pain.  We started her on Mobic,  which has helped it and she has found oxycodone beneficial for break-  through pain.  Therapy has been very helpful as well.  She has improved  range of motion, decreased pain overall.  She describes her pain more  intermittent than anything else.  But therapy also documents increased  endurance.  The patient rates her pain of 4/10 described as burning,  sometimes sharp, involving the right low back area.  Pain interferes  with general activity, relations with others and enjoyment of life on a  moderate level.  Pain seems to be worse in the evening hours.   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  She  would like to return working there if she can.  The patient smokes a  pack of cigarettes per day.  Family remains supportive.   REVIEW OF SYSTEMS:  Negative for any new issues, other than those  mentioned above.  Full reviews in the health and history section.   PHYSICAL EXAMINATION:  Blood pressure 149/90, pulse is 102, respiratory  rate 16, she is satting 97% on room air. The patient is pleasant and no  acute distress.  She is alert and oriented x3.  Affects bright and  appropriate.  Gait is stable.  Coordination is fair.  Reflexes are at  2+.  Sensation is normal.  Patient's range of motion in the lumbar spine  is much improved with 80-90 degrees of full flexion.  Extension caused  some pain, particularly to the left with left facet maneuvers.  Right  facet maneuvers were positive as well.  Patrick's test was equivocal.  Compression test was negative.  Patient remains overweight.  Postlaminectomy scars noted.  Cognitively she is intact.  Mood is  appropriate.   ASSESSMENT:  1. Chronic low back pain with history of lumbar disk disease and L2,      L3 diskectomy and fusion with laminectomy.  2. Lumbar facet disease.  3. Obesity.  4. Bipolar disorder.   PLAN:  1. Continue conservative  treatment.  She is making nice gains with      physical therapy.  They will continue for the next 3-4 weeks' time      to improve upon range of motion and endurance.  She needs to      continue with these exercises once home as well.  2. Continue Mobic 15 mg daily with food.  3. Continue oxycodone for break-through pain 5 mg q. 8 hours p.r.n.  4. Could still consider facet blocks in the future pending course of      improvement.  5. Wrote the patient out of work for the next few months time.  It may      not wise for her to return to this type and intensity of work      considering her back history, as she is very likely to exacerbate      the problem once again after      she returns.  6. I will see the patient back in about 2 months' time.      Ranelle Oyster, M.D.  Electronically Signed     ZTS/MedQ  D:  11/23/2006 15:33:23  T:  11/23/2006 16:41:33  Job #:  [id number]   cc:   Hayden Rasmussen  Fax: [phone]

## 2011-12-12 ENCOUNTER — Other Ambulatory Visit (HOSPITAL_COMMUNITY): Payer: Self-pay | Admitting: Family Medicine

## 2011-12-12 ENCOUNTER — Other Ambulatory Visit: Payer: Self-pay | Admitting: Family Medicine

## 2011-12-12 ENCOUNTER — Other Ambulatory Visit (HOSPITAL_COMMUNITY)
Admission: RE | Admit: 2011-12-12 | Discharge: 2011-12-12 | Disposition: A | Discharge status: Home / Self Care | Payer: Medicare Other | Source: Ambulatory Visit | Attending: Family Medicine | Admitting: Family Medicine

## 2011-12-12 DIAGNOSIS — Z124 Encounter for screening for malignant neoplasm of cervix: Secondary | ICD-10-CM | POA: Insufficient documentation

## 2011-12-12 DIAGNOSIS — R922 Inconclusive mammogram: Secondary | ICD-10-CM

## 2011-12-12 DIAGNOSIS — N63 Unspecified lump in unspecified breast: Secondary | ICD-10-CM

## 2011-12-12 DIAGNOSIS — Z1231 Encounter for screening mammogram for malignant neoplasm of breast: Secondary | ICD-10-CM

## 2011-12-13 ENCOUNTER — Encounter: Payer: Self-pay | Admitting: Internal Medicine

## 2011-12-20 ENCOUNTER — Ambulatory Visit
Admission: RE | Admit: 2011-12-20 | Discharge: 2011-12-20 | Disposition: A | Discharge status: Home / Self Care | Payer: Medicare Other | Source: Ambulatory Visit | Attending: Family Medicine | Admitting: Family Medicine

## 2011-12-20 DIAGNOSIS — N63 Unspecified lump in unspecified breast: Secondary | ICD-10-CM

## 2011-12-20 DIAGNOSIS — R922 Inconclusive mammogram: Secondary | ICD-10-CM

## 2011-12-25 ENCOUNTER — Ambulatory Visit (AMBULATORY_SURGERY_CENTER): Payer: Medicare Other | Admitting: *Deleted

## 2011-12-25 VITALS — Ht 64.0 in | Wt 236.3 lb

## 2011-12-25 DIAGNOSIS — Z1211 Encounter for screening for malignant neoplasm of colon: Secondary | ICD-10-CM

## 2011-12-25 MED ORDER — PEG-KCL-NACL-NASULF-NA ASC-C 100 G PO SOLR: ORAL | Status: DC

## 2012-01-08 ENCOUNTER — Other Ambulatory Visit: Payer: Self-pay | Admitting: Internal Medicine

## 2012-02-08 ENCOUNTER — Encounter: Payer: Self-pay | Admitting: Internal Medicine

## 2012-02-08 ENCOUNTER — Ambulatory Visit (AMBULATORY_SURGERY_CENTER): Payer: Medicare Other | Admitting: Internal Medicine

## 2012-02-08 VITALS — BP 109/60 | HR 94 | Temp 97.5°F | Resp 20 | Ht 64.0 in | Wt 236.0 lb

## 2012-02-08 DIAGNOSIS — D126 Benign neoplasm of colon, unspecified: Secondary | ICD-10-CM

## 2012-02-08 DIAGNOSIS — D129 Benign neoplasm of anus and anal canal: Secondary | ICD-10-CM

## 2012-02-08 DIAGNOSIS — D128 Benign neoplasm of rectum: Secondary | ICD-10-CM

## 2012-02-08 DIAGNOSIS — Z1211 Encounter for screening for malignant neoplasm of colon: Secondary | ICD-10-CM

## 2012-02-08 DIAGNOSIS — K648 Other hemorrhoids: Secondary | ICD-10-CM

## 2012-02-08 LAB — GLUCOSE, CAPILLARY: Glucose-Capillary: 254 mg/dL — ABNORMAL HIGH (ref 70–99)

## 2012-02-08 MED ORDER — SODIUM CHLORIDE 0.9 % IV SOLN: 500.0000 mL | INTRAVENOUS | Status: DC

## 2012-02-08 NOTE — Op Note (Addendum)
[facility] [address] [place], [place]  [address]  COLONOSCOPY PROCEDURE REPORT  PATIENT:  Becky Camacho, Becky Camacho  MR#:  [id number] BIRTHDATE:  10-04-1961, 50 yrs. old  GENDER:  female ENDOSCOPIST:  Iva Boop, MD, [facility] REF. BY:          Norberto Sorenson, MD ([facility]) PROCEDURE DATE:  02/08/2012 PROCEDURE:  Colonoscopy with biopsy and snare polypectomy ASA CLASS:  Class III INDICATIONS:  Routine Risk Screening MEDICATIONS:   These medications were titrated to patient response per physician's verbal order, MAC sedation, administered by CRNA, propofol (Diprivan) 280 mg IV  DESCRIPTION OF PROCEDURE:   After the risks benefits and alternatives of the procedure were thoroughly explained, informed consent was obtained.  Digital rectal exam was performed and revealed no abnormalities.   The LB 180AL [id number] endoscope was introduced through the anus and advanced to the cecum, which was identified by both the appendix and ileocecal valve, without limitations.  The quality of the prep was Miralax fair.  The instrument was then slowly withdrawn as the colon was fully examined. <<PROCEDUREIMAGES>>  FINDINGS:  There were multiple polyps identified and removed. in the left colon. 10 polyps descending to rectum. Cold snare and biopsy removal. 35mm-8mm in size. All sent to pathology.  This was otherwise a normal examination of the colon. The prep was fair in right colon and adequate at best in left colon. After irrigation. Adherent stool in right colon. The colon was also redundant. Retroflexed views in the rectum revealed internal hemorrhoids. The time to cecum = 5:40 minutes. The scope was then withdrawn in 30:45 minutes from the cecum and the procedure completed. COMPLICATIONS:  None ENDOSCOPIC IMPRESSION: 1) Polyps, multiple in the left colon - 10 removed, max 8 mm 2) Internal hemorrhoids 3) Otherwise normal examination 4) Fair prep RECOMMENDATIONS: repeat colonoscopy within 1 year  likely with MAC and more aggressive prep  Iva Boop, MD, [facility]  CC:  The Patient Addendum also copy Dr. Norberto Sorenson ([facility])  n. REVISED:  02/08/2012 10:28 AM eSIGNED:   Iva Boop at 02/08/2012 10:28 AM  Renold Don, [id number]

## 2012-02-08 NOTE — Progress Notes (Signed)
Patient did not have preoperative order for IV antibiotic SSI prophylaxis. (G8918)  Patient did not experience any of the following events: a burn prior to discharge; a fall within the facility; wrong site/side/patient/procedure/implant event; or a hospital transfer or hospital admission upon discharge from the facility. (G8907)  

## 2012-02-08 NOTE — Patient Instructions (Addendum)
10 polyps were removed today. The colon preparation was only fair to adequate which limited the exam some. I most likely plan to recommend a repeat colonoscopy within 1 year - will let you know after I get pathology results. Becky Boop, MD, FACG YOU HAD AN ENDOSCOPIC PROCEDURE TODAY AT THE [facility]: Refer to the procedure report that was given to you for any specific questions about what was found during the examination.  If the procedure report does not answer your questions, please call your gastroenterologist to clarify.  If you requested that your care partner not be given the details of your procedure findings, then the procedure report has been included in a sealed envelope for you to review at your convenience later.  YOU SHOULD EXPECT: Some feelings of bloating in the abdomen. Passage of more gas than usual.  Walking can help get rid of the air that was put into your GI tract during the procedure and reduce the bloating. If you had a lower endoscopy (such as a colonoscopy or flexible sigmoidoscopy) you may notice spotting of blood in your stool or on the toilet paper. If you underwent a bowel prep for your procedure, then you may not have a normal bowel movement for a few days.  DIET: Your first meal following the procedure should be a light meal and then it is ok to progress to your normal diet.  A half-sandwich or bowl of soup is an example of a good first meal.  Heavy or fried foods are harder to digest and may make you feel nauseous or bloated.  Likewise meals heavy in dairy and vegetables can cause extra gas to form and this can also increase the bloating.  Drink plenty of fluids but you should avoid alcoholic beverages for 24 hours.  ACTIVITY: Your care partner should take you home directly after the procedure.  You should plan to take it easy, moving slowly for the rest of the day.  You can resume normal activity the day after the procedure however you should NOT DRIVE or  use heavy machinery for 24 hours (because of the sedation medicines used during the test).    SYMPTOMS TO REPORT IMMEDIATELY: A gastroenterologist can be reached at any hour.  During normal business hours, 8:30 AM to 5:00 PM Monday through Friday, call [phone].  After hours and on weekends, please call the GI answering service at [phone] who will take a message and have the physician on call contact you.   Following lower endoscopy (colonoscopy or flexible sigmoidoscopy):  Excessive amounts of blood in the stool  Significant tenderness or worsening of abdominal pains  Swelling of the abdomen that is new, acute  Fever of 100F or higher FOLLOW UP: If any biopsies were taken you will be contacted by phone or by letter within the next 1-3 weeks.  Call your gastroenterologist if you have not heard about the biopsies in 3 weeks.  Our staff will call the home number listed on your records the next business day following your procedure to check on you and address any questions or concerns that you may have at that time regarding the information given to you following your procedure. This is a courtesy call and so if there is no answer at the home number and we have not heard from you through the emergency physician on call, we will assume that you have returned to your regular daily activities without incident.  SIGNATURES/CONFIDENTIALITY: You and/or your care  partner have signed paperwork which will be entered into your electronic medical record.  These signatures attest to the fact that that the information above on your After Visit Summary has been reviewed and is understood.  Full responsibility of the confidentiality of this discharge information lies with you and/or your care-partner.

## 2012-02-09 ENCOUNTER — Telehealth: Payer: Self-pay | Admitting: *Deleted

## 2012-02-09 LAB — GLUCOSE, CAPILLARY: Glucose-Capillary: 193 mg/dL — ABNORMAL HIGH (ref 70–99)

## 2012-02-09 NOTE — Telephone Encounter (Signed)
  Follow up Call-  Call back number 02/08/2012  Post procedure Call Back phone  # [phone]  Permission to leave phone message Yes     Patient questions:  Do you have a fever, pain , or abdominal swelling? no Pain Score  0 *  Have you tolerated food without any problems? yes  Have you been able to return to your normal activities? yes  Do you have any questions about your discharge instructions: Diet   no Medications  no Follow up visit  no  Do you have questions or concerns about your Care? no  Actions: * If pain score is 4 or above: No action needed, pain <4.

## 2012-02-14 ENCOUNTER — Encounter: Payer: Self-pay | Admitting: Internal Medicine

## 2012-02-14 DIAGNOSIS — Z860100 Personal history of colon polyps, unspecified: Secondary | ICD-10-CM | POA: Insufficient documentation

## 2012-02-14 HISTORY — DX: Personal history of colonic polyps: Z86.010

## 2012-02-14 NOTE — Progress Notes (Signed)
Quick Note:  10 serrated adenomas -   Office - call patient and arrange for repeat colonoscopy - propofol and 2 day prep needed to be done by June - her prep was not good enough and need to repeat the colonoscopy in this time frame  [facility] - no letter but place a colon recall for July as a back-up ______

## 2012-04-05 ENCOUNTER — Ambulatory Visit (AMBULATORY_SURGERY_CENTER): Payer: Medicare Other

## 2012-04-05 ENCOUNTER — Encounter: Payer: Medicare Other | Admitting: Internal Medicine

## 2012-04-05 VITALS — Ht 62.0 in | Wt 229.9 lb

## 2012-04-05 DIAGNOSIS — Z860100 Personal history of colon polyps, unspecified: Secondary | ICD-10-CM

## 2012-04-05 DIAGNOSIS — Z1211 Encounter for screening for malignant neoplasm of colon: Secondary | ICD-10-CM

## 2012-04-05 DIAGNOSIS — Z800 Family history of malignant neoplasm of digestive organs: Secondary | ICD-10-CM

## 2012-04-05 MED ORDER — PEG-KCL-NACL-NASULF-NA ASC-C 100 G PO SOLR: 1.0000 | Freq: Once | ORAL | Status: AC

## 2012-04-16 ENCOUNTER — Other Ambulatory Visit: Payer: Medicare Other | Admitting: Internal Medicine

## 2012-04-23 ENCOUNTER — Other Ambulatory Visit: Payer: Medicare Other | Admitting: Internal Medicine

## 2012-05-02 ENCOUNTER — Encounter: Payer: Self-pay | Admitting: Internal Medicine

## 2012-05-02 ENCOUNTER — Ambulatory Visit (AMBULATORY_SURGERY_CENTER): Payer: Medicare Other | Admitting: Internal Medicine

## 2012-05-02 VITALS — BP 114/74 | HR 96 | Temp 95.1°F | Resp 18 | Ht 62.0 in | Wt 229.0 lb

## 2012-05-02 DIAGNOSIS — Z86010 Personal history of colonic polyps: Secondary | ICD-10-CM

## 2012-05-02 DIAGNOSIS — D126 Benign neoplasm of colon, unspecified: Secondary | ICD-10-CM

## 2012-05-02 DIAGNOSIS — K635 Polyp of colon: Secondary | ICD-10-CM

## 2012-05-02 DIAGNOSIS — Z1211 Encounter for screening for malignant neoplasm of colon: Secondary | ICD-10-CM

## 2012-05-02 LAB — GLUCOSE, CAPILLARY

## 2012-05-02 MED ORDER — SODIUM CHLORIDE 0.9 % IV SOLN: 500.0000 mL | INTRAVENOUS | Status: DC

## 2012-05-02 NOTE — Progress Notes (Signed)
Patient did not experience any of the following events: a burn prior to discharge; a fall within the facility; wrong site/side/patient/procedure/implant event; or a hospital transfer or hospital admission upon discharge from the facility. (G8907) Patient did not have preoperative order for IV antibiotic SSI prophylaxis. (G8918)  

## 2012-05-02 NOTE — Patient Instructions (Signed)
Discharge instructions given with verbal understanding. Handouts on polyps and hemorrhoids given. Resume previous medications. YOU HAD AN ENDOSCOPIC PROCEDURE TODAY AT THE [facility]: Refer to the procedure report that was given to you for any specific questions about what was found during the examination.  If the procedure report does not answer your questions, please call your gastroenterologist to clarify.  If you requested that your care partner not be given the details of your procedure findings, then the procedure report has been included in a sealed envelope for you to review at your convenience later.  YOU SHOULD EXPECT: Some feelings of bloating in the abdomen. Passage of more gas than usual.  Walking can help get rid of the air that was put into your GI tract during the procedure and reduce the bloating. If you had a lower endoscopy (such as a colonoscopy or flexible sigmoidoscopy) you may notice spotting of blood in your stool or on the toilet paper. If you underwent a bowel prep for your procedure, then you may not have a normal bowel movement for a few days.  DIET: Your first meal following the procedure should be a light meal and then it is ok to progress to your normal diet.  A half-sandwich or bowl of soup is an example of a good first meal.  Heavy or fried foods are harder to digest and may make you feel nauseous or bloated.  Likewise meals heavy in dairy and vegetables can cause extra gas to form and this can also increase the bloating.  Drink plenty of fluids but you should avoid alcoholic beverages for 24 hours.  ACTIVITY: Your care partner should take you home directly after the procedure.  You should plan to take it easy, moving slowly for the rest of the day.  You can resume normal activity the day after the procedure however you should NOT DRIVE or use heavy machinery for 24 hours (because of the sedation medicines used during the test).    SYMPTOMS TO REPORT  IMMEDIATELY: A gastroenterologist can be reached at any hour.  During normal business hours, 8:30 AM to 5:00 PM Monday through Friday, call [phone].  After hours and on weekends, please call the GI answering service at [phone] who will take a message and have the physician on call contact you.   Following lower endoscopy (colonoscopy or flexible sigmoidoscopy):  Excessive amounts of blood in the stool  Significant tenderness or worsening of abdominal pains  Swelling of the abdomen that is new, acute  Fever of 100F or higher FOLLOW UP: If any biopsies were taken you will be contacted by phone or by letter within the next 1-3 weeks.  Call your gastroenterologist if you have not heard about the biopsies in 3 weeks.  Our staff will call the home number listed on your records the next business day following your procedure to check on you and address any questions or concerns that you may have at that time regarding the information given to you following your procedure. This is a courtesy call and so if there is no answer at the home number and we have not heard from you through the emergency physician on call, we will assume that you have returned to your regular daily activities without incident.  SIGNATURES/CONFIDENTIALITY: You and/or your care partner have signed paperwork which will be entered into your electronic medical record.  These signatures attest to the fact that that the information above on your After Visit Summary   has been reviewed and is understood.  Full responsibility of the confidentiality of this discharge information lies with you and/or your care-partner.  

## 2012-05-02 NOTE — Op Note (Signed)
[facility] [address] [place], [place]  [address]  COLONOSCOPY PROCEDURE REPORT  PATIENT:  Becky Camacho, Becky Camacho  MR#:  [id number] BIRTHDATE:  [birth year], 51 yrs. old  GENDER:  female ENDOSCOPIST:  Iva Boop, MD, [facility]  PROCEDURE DATE:  05/02/2012 PROCEDURE:  Colonoscopy with snare polypectomy ASA CLASS:  Class III INDICATIONS:  follow-up of polyp 10 serrated adenomas removed in 01/2012, prep was not adequate so returned early to clear colon MEDICATIONS:   These medications were titrated to patient response per physician's verbal order, MAC sedation, administered by CRNA, propofol (Diprivan) 240 mg IV  DESCRIPTION OF PROCEDURE:   After the risks benefits and alternatives of the procedure were thoroughly explained, informed consent was obtained.  Digital rectal exam was performed and revealed no abnormalities.   The LB CF-H180AL [id number] endoscope was introduced through the anus and advanced to the cecum, which was identified by both the appendix and ileocecal valve, without limitations.  The quality of the prep was excellent, using MoviPrep.  The instrument was then slowly withdrawn as the colon was fully examined. <<PROCEDUREIMAGES>>  FINDINGS:  Four polyps were found. 5 and 10 mm transverse polyps, 6 mm sigmoid and 2 mm rectal polyp. All cold snare removal, rectal polyp not recovered but others sent to pathology.  This was otherwise a normal examination of the colon. Includes right colon retroflexion.   Retroflexed views in the rectum revealed internal hemorrhoids.    The time to cecum = 5:02 minutes. The scope was then withdrawn in 13:20 minutes from the cecum and the procedure completed. COMPLICATIONS:  None ENDOSCOPIC IMPRESSION: 1) Four polyps removed, largest 10 mm (3 recovered) 2) Internal hemorrhoids 3) Otherwise normal examination, excellent prep (2 day prep) 4) 10 serrated adenomas removed in 01/2012  REPEAT EXAM:  In for Colonoscopy, pending biopsy  results.  Iva Boop, MD, [facility]  CC:  The Patient and Pennelope Bracken, MD  n. Rosalie Doctor:   Iva Boop at 05/02/2012 02:05 PM  Renold Don, [phone]

## 2012-05-03 ENCOUNTER — Telehealth: Payer: Self-pay

## 2012-05-03 NOTE — Telephone Encounter (Signed)
  Follow up Call-  Call back number 05/02/2012 02/08/2012  Post procedure Call Back phone  # [phone]  Permission to leave phone message Yes Yes     Patient questions:  Do you have a fever, pain , or abdominal swelling? no Pain Score  0 *  Have you tolerated food without any problems? yes  Have you been able to return to your normal activities? yes  Do you have any questions about your discharge instructions: Diet   no Medications  no Follow up visit  no  Do you have questions or concerns about your Care? no  Actions: * If pain score is 4 or above: No action needed, pain <4.

## 2012-05-09 ENCOUNTER — Encounter: Payer: Self-pay | Admitting: Internal Medicine

## 2012-05-09 NOTE — Progress Notes (Signed)
Quick Note:  Sessile serrated adenoma and 2 hyperplastic polyps Repeat colon about 04/2015 ______

## 2015-06-15 ENCOUNTER — Encounter: Payer: Self-pay | Admitting: Internal Medicine

## 2015-07-30 ENCOUNTER — Encounter: Payer: Self-pay | Admitting: Internal Medicine

## 2015-09-29 ENCOUNTER — Ambulatory Visit (AMBULATORY_SURGERY_CENTER): Payer: Self-pay | Admitting: *Deleted

## 2015-09-29 VITALS — Ht 62.0 in | Wt 212.0 lb

## 2015-09-29 DIAGNOSIS — Z86010 Personal history of colonic polyps: Secondary | ICD-10-CM

## 2015-09-29 MED ORDER — MOVIPREP 100 G PO SOLR: ORAL | Status: DC

## 2015-09-29 NOTE — Progress Notes (Signed)
Patient denies any allergies to eggs or soy. Patient denies any problems with anesthesia/sedation. Patient denies any oxygen use at home and does not take any diet/weight loss medications. No computer per pt. Gave patient Moviprep, as she took that last time and was cleaned out excellent.

## 2015-10-13 ENCOUNTER — Ambulatory Visit (AMBULATORY_SURGERY_CENTER): Payer: Medicare Other | Admitting: Internal Medicine

## 2015-10-13 ENCOUNTER — Encounter: Payer: Self-pay | Admitting: Internal Medicine

## 2015-10-13 VITALS — BP 131/72 | HR 77 | Temp 96.9°F | Resp 16 | Ht 62.0 in | Wt 212.0 lb

## 2015-10-13 DIAGNOSIS — D123 Benign neoplasm of transverse colon: Secondary | ICD-10-CM | POA: Diagnosis not present

## 2015-10-13 DIAGNOSIS — Z86010 Personal history of colonic polyps: Secondary | ICD-10-CM

## 2015-10-13 DIAGNOSIS — D122 Benign neoplasm of ascending colon: Secondary | ICD-10-CM | POA: Diagnosis not present

## 2015-10-13 LAB — GLUCOSE, CAPILLARY
GLUCOSE-CAPILLARY: 128 mg/dL — AB (ref 65–99)
Glucose-Capillary: 110 mg/dL — ABNORMAL HIGH (ref 65–99)

## 2015-10-13 MED ORDER — SODIUM CHLORIDE 0.9 % IV SOLN: 500.0000 mL | INTRAVENOUS | Status: DC

## 2015-10-13 NOTE — Progress Notes (Signed)
Patient awakening,vss,report to rn 

## 2015-10-13 NOTE — Patient Instructions (Addendum)
I found and removed 4 small polyps.  I will let you know pathology results and when to have another routine colonoscopy by mail.  I appreciate the opportunity to care for you. Gatha Mayer, MD, FACG   YOU HAD AN ENDOSCOPIC PROCEDURE TODAY AT [facility]:   Refer to the procedure report that was given to you for any specific questions about what was found during the examination.  If the procedure report does not answer your questions, please call your gastroenterologist to clarify.  If you requested that your care partner not be given the details of your procedure findings, then the procedure report has been included in a sealed envelope for you to review at your convenience later.  YOU SHOULD EXPECT: Some feelings of bloating in the abdomen. Passage of more gas than usual.  Walking can help get rid of the air that was put into your GI tract during the procedure and reduce the bloating. If you had a lower endoscopy (such as a colonoscopy or flexible sigmoidoscopy) you may notice spotting of blood in your stool or on the toilet paper. If you underwent a bowel prep for your procedure, you may not have a normal bowel movement for a few days.  Please Note:  You might notice some irritation and congestion in your nose or some drainage.  This is from the oxygen used during your procedure.  There is no need for concern and it should clear up in a day or so.  SYMPTOMS TO REPORT IMMEDIATELY:   Following lower endoscopy (colonoscopy or flexible sigmoidoscopy):  Excessive amounts of blood in the stool  Significant tenderness or worsening of abdominal pains  Swelling of the abdomen that is new, acute  Fever of 100F or higher   For urgent or emergent issues, a gastroenterologist can be reached at any hour by calling [phone].   DIET: Your first meal following the procedure should be a small meal and then it is ok to progress to your normal diet. Heavy or fried foods  are harder to digest and may make you feel nauseous or bloated.  Likewise, meals heavy in dairy and vegetables can increase bloating.  Drink plenty of fluids but you should avoid alcoholic beverages for 24 hours.  ACTIVITY:  You should plan to take it easy for the rest of today and you should NOT DRIVE or use heavy machinery until tomorrow (because of the sedation medicines used during the test).    FOLLOW UP: Our staff will call the number listed on your records the next business day following your procedure to check on you and address any questions or concerns that you may have regarding the information given to you following your procedure. If we do not reach you, we will leave a message.  However, if you are feeling well and you are not experiencing any problems, there is no need to return our call.  We will assume that you have returned to your regular daily activities without incident.  If any biopsies were taken you will be contacted by phone or by letter within the next 1-3 weeks.  Please call us at [phone] if you have not heard about the biopsies in 3 weeks.    SIGNATURES/CONFIDENTIALITY: You and/or your care partner have signed paperwork which will be entered into your electronic medical record.  These signatures attest to the fact that that the information above on your After Visit Summary has been reviewed and is understood.  Full responsibility of the confidentiality of this discharge information lies with you and/or your care-partner.

## 2015-10-13 NOTE — Op Note (Signed)
[facility]  [address] [place], [id number]   COLONOSCOPY PROCEDURE REPORT  PATIENT: Becky Camacho, Becky Camacho  MR#: [id number] BIRTHDATE: [birth year] , 26  yrs. old GENDER: female ENDOSCOPIST: Gatha Mayer, MD, [facility] PROCEDURE DATE:  10/13/2015 PROCEDURE:   Colonoscopy, screening and Colonoscopy with snare polypectomy Prior Negative Screening - Now for repeat screening.  N/A History of Adenoma - Now for follow-up colonoscopy & has been > or = to 3 yrs.  Yes hx of adenoma.  Has been 3 or more years since last colonoscopy.  Polyps removed today? Yes ASA CLASS:   Class III INDICATIONS:Surveillance due to prior colonic neoplasia and PH Colon Adenoma. MEDICATIONS: Propofol 250 mg IV and Monitored anesthesia care  DESCRIPTION OF PROCEDURE:   After the risks benefits and alternatives of the procedure were thoroughly explained, informed consent was obtained.  The digital rectal exam      The LB [id number]  endoscope was introduced through the anus and advanced to the cecum, which was identified by both the appendix and ileocecal valve. No adverse events experienced.   The quality of the prep was excellent.  (MiraLax was used)  The instrument was then slowly withdrawn as the colon was fully examined. Estimated blood loss is zero unless otherwise noted in this procedure report.      COLON FINDINGS: Four polypoid shaped sessile polyps ranging from 3 to 33mm in size were found in the transverse colon and ascending colon.  Polypectomies were performed with a cold snare.  The resection was complete, the polyp tissue was completely retrieved and sent to histology.   The examination was otherwise normal. Retroflexed views revealed no abnormalities. The time to cecum = 2.4 Withdrawal time = 14.8   The scope was withdrawn and the procedure completed. COMPLICATIONS: There were no immediate complications.  ENDOSCOPIC IMPRESSION: 1.   Four sessile polyps ranging from 3 to 23mm  in size were found in the transverse colon and ascending colon; polypectomies were performed with a cold snare 2.   The examination was otherwise normal - excellent prep  RECOMMENDATIONS: Timing of repeat colonoscopy will be determined by pathology findings.  She has hx multiple adenomas 2013  eSigned:  Gatha Mayer, MD, [facility] 10/13/2015 10:41 AM   cc: The Patient and Dr. Kevan Ny

## 2015-10-13 NOTE — Progress Notes (Signed)
Called to room to assist during endoscopic procedure.  Patient ID and intended procedure confirmed with present staff. Received instructions for my participation in the procedure from the performing physician.  

## 2015-10-14 ENCOUNTER — Telehealth: Payer: Self-pay | Admitting: Internal Medicine

## 2015-10-14 NOTE — Telephone Encounter (Signed)
°  Follow up Call-  Call back number 10/13/2015  Post procedure Call Back phone  # [phone]  Permission to leave phone message Yes     Patient questions:  Do you have a fever, pain , or abdominal swelling? No. Pain Score  0 *  Have you tolerated food without any problems? Yes.    Have you been able to return to your normal activities? Yes.    Do you have any questions about your discharge instructions: Diet   No. Medications  No. Follow up visit  No.  Do you have questions or concerns about your Care? No.  Actions: * If pain score is 4 or above: No action needed, pain <4.

## 2015-10-24 ENCOUNTER — Encounter: Payer: Self-pay | Admitting: Internal Medicine

## 2015-10-24 DIAGNOSIS — Z86010 Personal history of colonic polyps: Secondary | ICD-10-CM

## 2015-10-24 NOTE — Progress Notes (Signed)
Quick Note:  2 adenomas and 2 hyperplastic 6 mm max repeat colon 2021 ______

## 2015-10-24 NOTE — Progress Notes (Signed)
Quick Note:  Correction has hx multiple polyps in 2013 (10) will repeat in 2019 ______

## 2016-12-28 ENCOUNTER — Ambulatory Visit (HOSPITAL_COMMUNITY)
Admission: RE | Admit: 2016-12-28 | Discharge: 2016-12-28 | Disposition: A | Discharge status: Home / Self Care | Payer: Medicare Other | Source: Ambulatory Visit | Attending: Internal Medicine | Admitting: Internal Medicine

## 2016-12-28 ENCOUNTER — Other Ambulatory Visit (HOSPITAL_COMMUNITY): Payer: Self-pay | Admitting: Internal Medicine

## 2016-12-28 DIAGNOSIS — R058 Other specified cough: Secondary | ICD-10-CM

## 2016-12-28 DIAGNOSIS — R05 Cough: Secondary | ICD-10-CM | POA: Insufficient documentation

## 2017-04-21 ENCOUNTER — Emergency Department (HOSPITAL_BASED_OUTPATIENT_CLINIC_OR_DEPARTMENT_OTHER)
Admission: EM | Admit: 2017-04-21 | Discharge: 2017-04-21 | Disposition: A | Discharge status: Home / Self Care | Payer: Medicare Other | Attending: Emergency Medicine | Admitting: Emergency Medicine

## 2017-04-21 ENCOUNTER — Encounter (HOSPITAL_BASED_OUTPATIENT_CLINIC_OR_DEPARTMENT_OTHER): Payer: Self-pay | Admitting: Emergency Medicine

## 2017-04-21 ENCOUNTER — Emergency Department (HOSPITAL_BASED_OUTPATIENT_CLINIC_OR_DEPARTMENT_OTHER): Payer: Medicare Other

## 2017-04-21 DIAGNOSIS — Z7984 Long term (current) use of oral hypoglycemic drugs: Secondary | ICD-10-CM | POA: Insufficient documentation

## 2017-04-21 DIAGNOSIS — R0781 Pleurodynia: Secondary | ICD-10-CM

## 2017-04-21 DIAGNOSIS — F17210 Nicotine dependence, cigarettes, uncomplicated: Secondary | ICD-10-CM | POA: Insufficient documentation

## 2017-04-21 DIAGNOSIS — E119 Type 2 diabetes mellitus without complications: Secondary | ICD-10-CM | POA: Diagnosis not present

## 2017-04-21 DIAGNOSIS — R0789 Other chest pain: Secondary | ICD-10-CM | POA: Insufficient documentation

## 2017-04-21 DIAGNOSIS — E039 Hypothyroidism, unspecified: Secondary | ICD-10-CM | POA: Insufficient documentation

## 2017-04-21 DIAGNOSIS — R05 Cough: Secondary | ICD-10-CM | POA: Diagnosis present

## 2017-04-21 DIAGNOSIS — R059 Cough, unspecified: Secondary | ICD-10-CM

## 2017-04-21 DIAGNOSIS — I10 Essential (primary) hypertension: Secondary | ICD-10-CM | POA: Insufficient documentation

## 2017-04-21 LAB — CBC WITH DIFFERENTIAL/PLATELET
BASOS ABS: 0.1 10*3/uL (ref 0.0–0.1)
Basophils Relative: 1 %
EOS ABS: 0.1 10*3/uL (ref 0.0–0.7)
EOS PCT: 1 %
HCT: 37.8 % (ref 36.0–46.0)
Hemoglobin: 12.8 g/dL (ref 12.0–15.0)
Lymphocytes Relative: 33 %
Lymphs Abs: 3.2 10*3/uL (ref 0.7–4.0)
MCH: 32.5 pg (ref 26.0–34.0)
MCHC: 33.9 g/dL (ref 30.0–36.0)
MCV: 95.9 fL (ref 78.0–100.0)
Monocytes Absolute: 0.8 10*3/uL (ref 0.1–1.0)
Monocytes Relative: 8 %
Neutro Abs: 5.7 10*3/uL (ref 1.7–7.7)
Neutrophils Relative %: 58 %
PLATELETS: 227 10*3/uL (ref 150–400)
RBC: 3.94 MIL/uL (ref 3.87–5.11)
RDW: 13.2 % (ref 11.5–15.5)
WBC: 9.8 10*3/uL (ref 4.0–10.5)

## 2017-04-21 LAB — BASIC METABOLIC PANEL
Anion gap: 11 (ref 5–15)
BUN: 12 mg/dL (ref 6–20)
CO2: 25 mmol/L (ref 22–32)
CREATININE: 0.96 mg/dL (ref 0.44–1.00)
Calcium: 9.3 mg/dL (ref 8.9–10.3)
Chloride: 96 mmol/L — ABNORMAL LOW (ref 101–111)
GFR calc Af Amer: >60 mL/min (ref >60)
Glucose, Bld: 227 mg/dL — ABNORMAL HIGH (ref 65–99)
Potassium: 3.9 mmol/L (ref 3.5–5.1)
Sodium: 132 mmol/L — ABNORMAL LOW (ref 135–145)

## 2017-04-21 MED ORDER — IOPAMIDOL (ISOVUE-370) INJECTION 76%
100.0000 mL | Freq: Once | INTRAVENOUS | Status: AC | PRN
  Administered 2017-04-21: 100 mL via INTRAVENOUS

## 2017-04-21 MED ORDER — BENZONATATE 100 MG PO CAPS
100.0000 mg | ORAL_CAPSULE | Freq: Once | ORAL | Status: AC
  Administered 2017-04-21: 100 mg via ORAL
  Filled 2017-04-21: qty 1

## 2017-04-21 MED ORDER — ALBUTEROL SULFATE HFA 108 (90 BASE) MCG/ACT IN AERS: 1.0000 | INHALATION_SPRAY | Freq: Four times a day (QID) | RESPIRATORY_TRACT | 0 refills | Status: AC | PRN

## 2017-04-21 MED ORDER — BENZONATATE 100 MG PO CAPS: 100.0000 mg | ORAL_CAPSULE | Freq: Three times a day (TID) | ORAL | 0 refills | Status: DC

## 2017-04-21 MED ORDER — KETOROLAC TROMETHAMINE 30 MG/ML IJ SOLN
15.0000 mg | Freq: Once | INTRAMUSCULAR | Status: AC
  Administered 2017-04-21: 15 mg via INTRAVENOUS
  Filled 2017-04-21: qty 1

## 2017-04-21 NOTE — ED Triage Notes (Signed)
Pt seen at [facility] earlier this week, dx with pneumonia. Pt states she was told to return if she wasn't feeling better in a few days. Pt states she is still coughing and now her side hurts.

## 2017-04-21 NOTE — Discharge Instructions (Signed)
Uric CT scan shows no signs of pneumonia or blood clot. Please continue your complete course of antibiotics. This is likely costochondritis or pleurisy from your pneumonia or from coughing. Would encourage around the clock NSAID to help with inflammation. May use the albuterol inhaler as needed. Heating pad to the affected area. Make sure follow-up with primary care doctor next week if symptoms are not improving. Return to the ED if he developed any chest pain or shortness of breath.

## 2017-04-21 NOTE — ED Notes (Signed)
Pt reports she has been on Levaquin since 5/23 and has 7 days more she still needs to take. Pt's primary concern today is that the pain in her L chest wall and flank has become worse.

## 2017-04-22 NOTE — ED Provider Notes (Signed)
[facility] DEPT Provider Note   CSN: [id number] Arrival date & time: 04/21/17  1423     History   Chief Complaint Chief Complaint  Patient presents with  . Cough    HPI Becky Camacho is a 56 y.o. female.  HPI 56 year old Caucasian female past medical history significant for diabetes, hypertension, hypothyroidism, bipolar disorder, depression presents to the emergency department today with complaints of persistent cough and left lateral rib pain. The patient states that she was diagnosed with pneumonia 3 days ago at outside hospital. Patient was started on Levaquin. She was taken for 3 days. She was also given prednisone, cough medicine. The patient is not taking the prednisone a cough medicine because it is interacting with her bipolar medications. Patient claims a persistent productive cough. Patient also plans of pain in her left lateral rib. She was told she had pleurisy. She has not taken any anti-inflammatories for her pain. Coughing makes the pain worse. Nothing makes the pain better. Patient denies any fevers. She denies any history of DVT/PE, prolonged immobilizations, recent hospitalizations or surgeries, lower show any edema, calf tenderness, tobacco use. Patient denies any chest pressure and shortness of breath. She denies fever, chills headache or vision changes, dizziness, nausea or vomiting or urinary symptoms. Past Medical History:  Diagnosis Date  . Anxiety   . Arthritis   . Bipolar disorder ([place])   . Depression   . Diabetes mellitus   . Hyperlipidemia   . Hypertension   . Hypothyroidism   . Personal history of colonic polyps - serrated adenomas 02/14/2012   10 serrated adenomas max 8 mm removed 01/2012 Carlean Purl     Patient Active Problem List   Diagnosis Date Noted  . Personal history of colonic polyps - serrated adenomas 02/14/2012    Past Surgical History:  Procedure Laterality Date  . [place]  . COLONOSCOPY    . LUMBAR FUSION   2004    OB History    No data available       Home Medications    Prior to Admission medications   Medication Sig Start Date End Date Taking? Authorizing Provider  Cholecalciferol (D3-1000 PO) Take by mouth daily.   Yes [provider]  cyclobenzaprine (FLEXERIL) 5 MG tablet Take 5 mg by mouth 2 (two) times daily as needed.   Yes [provider]  diclofenac (VOLTAREN) 75 MG EC tablet Take 75 mg by mouth 2 (two) times daily.   Yes [provider]  divalproex (DEPAKOTE ER) 250 MG 24 hr tablet Take 250 mg by mouth daily.   Yes [provider]  divalproex (DEPAKOTE ER) 500 MG 24 hr tablet Take 500 mg by mouth daily. Takes 2 caps at bedtime.   Yes [provider]  fenofibrate 160 MG tablet Take 160 mg by mouth daily.   Yes [provider]  FLUoxetine (PROZAC) 40 MG capsule  09/21/15  Yes [provider]  glyBURIDE-metformin (GLUCOVANCE) 5-500 MG per tablet Take 2 tablets by mouth 2 (two) times daily with a meal.    Yes [provider]  levothyroxine (SYNTHROID, LEVOTHROID) 175 MCG tablet  09/22/15  Yes [provider]  losartan-hydrochlorothiazide (HYZAAR) 50-12.5 MG per tablet Take 1 tablet by mouth daily.   Yes [provider]  omeprazole (PRILOSEC) 40 MG capsule Take 40 mg by mouth daily.   Yes [provider]  pravastatin (PRAVACHOL) 40 MG tablet Take 40 mg by mouth at bedtime.   Yes  [provider]  risperiDONE (RISPERDAL) 1 MG tablet Take 1 mg by mouth at bedtime.   Yes [provider]  traZODone (DESYREL) 100 MG tablet Take 100 mg by mouth at bedtime. Takes 1.5 tablets at bedtime   Yes [provider]  albuterol (PROVENTIL HFA;VENTOLIN HFA) 108 (90 Base) MCG/ACT inhaler Inhale 1-2 puffs into the lungs every 6 (six) hours as needed for wheezing or shortness of breath. 04/21/17   Tylee Yum, Zack Seal, PA-C  ALPRAZolam Duanne Moron) 0.5 MG tablet Take 0.5 mg by mouth 2  (two) times daily.    [provider]  aspirin 81 MG tablet Take 81 mg by mouth daily.     [provider]  benzonatate (TESSALON) 100 MG capsule Take 1 capsule (100 mg total) by mouth every 8 (eight) hours. 04/21/17   Doristine Devoid, PA-C  clonazePAM (KLONOPIN) 0.5 MG tablet  09/21/15   [provider]  paliperidone (INVEGA) 3 MG 24 hr tablet Take 3 mg by mouth at bedtime.    [provider]  simvastatin (ZOCOR) 40 MG tablet Take 40 mg by mouth at bedtime.    [provider]  sitaGLIPtin (JANUVIA) 100 MG tablet Take 100 mg by mouth daily.    [provider]  Tetrahydrozoline-PEG (EYE MOISTURIZING RELIEF OP) Apply to eye 2 (two) times daily.    [provider]    Family History Family History  Problem Relation Age of Onset  . Heart disease Father   . Colon cancer Paternal Uncle 63  . Stomach cancer Neg Hx     Social History Social History  Substance Use Topics  . Smoking status: Current Every Day Smoker    Packs/day: 1.00    Types: Cigarettes  . Smokeless tobacco: Never Used  . Alcohol use No     Allergies   Codeine; Niacin and related; and Penicillins   Review of Systems Review of Systems  Constitutional: Negative for chills and fever.  HENT: Negative for congestion.   Eyes: Negative for visual disturbance.  Respiratory: Positive for cough. Negative for shortness of breath and wheezing.   Cardiovascular: Negative for chest pain, palpitations and leg swelling.  Gastrointestinal: Negative for abdominal pain, diarrhea, nausea and vomiting.  Genitourinary: Negative for dysuria, frequency, hematuria and urgency.  Musculoskeletal: Negative for back pain.  Skin: Negative.   Neurological: Negative for dizziness, syncope, weakness, light-headedness and headaches.     Physical Exam Updated Vital Signs BP (!) 151/75   Pulse 81   Temp 98.3 F (36.8 C) (Oral)   Resp 20   Ht 5\' 2"  (1.575 m)   Wt 95.3 kg (210  lb)   SpO2 100%   BMI 38.41 kg/m   Physical Exam  Constitutional: She is oriented to person, place, and time. She appears well-developed and well-nourished. No distress.  Well-appearing female in no acute distress. Nontoxic appearing.  HENT:  Head: Normocephalic and atraumatic.  Mouth/Throat: Oropharynx is clear and moist.  Eyes: Conjunctivae are normal. Right eye exhibits no discharge. Left eye exhibits no discharge. No scleral icterus.  Neck: Normal range of motion. Neck supple. No thyromegaly present.  Cardiovascular: Normal rate, regular rhythm, normal heart sounds and intact distal pulses.  Exam reveals no gallop and no friction rub.   No murmur heard. Pulmonary/Chest: Effort normal and breath sounds normal. No respiratory distress. She has no wheezes. She has no rales. She exhibits tenderness (left lateral chest wall).  Productive cough noted. No hypoxia or tachypnea.  Abdominal: Soft. Bowel  sounds are normal. She exhibits no distension. There is no tenderness. There is no rebound and no guarding.  Musculoskeletal: Normal range of motion.  No lowerextremity  edema or calf tenderness.  Lymphadenopathy:    She has no cervical adenopathy.  Neurological: She is alert and oriented to person, place, and time.  Skin: Skin is warm and dry. Capillary refill takes less than 2 seconds.  Nursing note and vitals reviewed.    ED Treatments / Results  Labs (all labs ordered are listed, but only abnormal results are displayed) Labs Reviewed  BASIC METABOLIC PANEL - Abnormal; Notable for the following:       Result Value   Sodium 132 (*)    Chloride 96 (*)    Glucose, Bld 227 (*)    All other components within normal limits  CBC WITH DIFFERENTIAL/PLATELET    EKG  EKG Interpretation None       Radiology Dg Chest 2 View  Result Date: 04/21/2017 CLINICAL DATA:  Cough, congestion for 1 week EXAM: CHEST  2 VIEW COMPARISON:  12/28/2016 FINDINGS: The heart size and mediastinal  contours are within normal limits. Both lungs are clear. The visualized skeletal structures are unremarkable. IMPRESSION: No active cardiopulmonary disease. Electronically Signed   By: Kathreen Devoid   On: 04/21/2017 14:48   Ct Angio Chest Pe W/cm &/or Wo Cm  Result Date: 04/21/2017 CLINICAL DATA:  Left-sided chest pain, pneumonia EXAM: CT ANGIOGRAPHY CHEST WITH CONTRAST TECHNIQUE: Multidetector CT imaging of the chest was performed using the standard protocol during bolus administration of intravenous contrast. Multiplanar CT image reconstructions and MIPs were obtained to evaluate the vascular anatomy. CONTRAST:  100 mL Isovue 370. COMPARISON:  04/21/2017 FINDINGS: Cardiovascular: Atherosclerotic changes of the thoracic aorta are noted. No aneurysmal dilatation or dissection is seen. The pulmonary artery demonstrates a normal branching pattern without intraluminal filling defect to suggest pulmonary embolism. Coronary calcifications are noted. Mediastinum/Nodes: Thoracic inlet is within normal limits. No hilar or mediastinal adenopathy is seen. Lungs/Pleura: Lungs are well aerated bilaterally. Mild dependent changes are noted. No focal infiltrate or sizable effusion is seen. Upper Abdomen: Within normal limits. Musculoskeletal: Mild degenerative changes of thoracic spine are noted. Review of the MIP images confirms the above findings. IMPRESSION: No evidence of pulmonary emboli. No acute abnormality noted. Electronically Signed   By: Inez Catalina M.D.   On: 04/21/2017 17:22    Procedures Procedures (including critical care time)  Medications Ordered in ED Medications  iopamidol (ISOVUE-370) 76 % injection 100 mL (100 mLs Intravenous Contrast Given 04/21/17 1644)  ketorolac (TORADOL) 30 MG/ML injection 15 mg (15 mg Intravenous Given 04/21/17 1730)  benzonatate (TESSALON) capsule 100 mg (100 mg Oral Given 04/21/17 1729)     Initial Impression / Assessment and Plan / ED Course  I have reviewed the  triage vital signs and the nursing notes.  Pertinent labs & imaging results that were available during my care of the patient were reviewed by me and considered in my medical decision making (see chart for details).     Patient was to the emergency department today with complaints of ongoing cough and left lateral rib pain after being diagnosed with pneumonia 3 days ago started on Levaquin. The patient has been taking any other medications for her symptoms. She is also told that she has pleurisy. Patient is nontoxic appearing. Vital signs are stable she is afebrile. Tachycardia. Patient does not meet Sirs or sepsis criteria. On exam patient is well-appearing. Lungs are clear  to auscultation bilaterally. She does have left lateral chest wall tenderness to palpation. The patient is not tachycardic or hypoxia. Patient does seem to be in pain and coughing. She denies cp or shortness of breath. Chest-ray is unremarkable without any focal infiltrate. Given patient's persistent cough of left lateral rib pain PE study was ordered after discussing with pt although have low suspicion as she is low risk. CT shows no signs of PE. No pneumonia seen on the CT scan. Patient has no leukocytosis. All other labs at baseline. Clinical presentation is concerning for ACS or dissection. Symptoms seem to be due to pleurisy versus muscle spasms from coughing. He should occur she is to [facility] for cough. She was treated with Toradol given normal creatinine was similar symptoms. Encouraged to follow-up with her PCP next week. Encouraged to return to the ED she feels a chest pain or shortness of breath. Patient was understanding with plan of care all questions were answered prior to discharge. On discharge patient was stable in no acute distress.  Final Clinical Impressions(s) / ED Diagnoses   Final diagnoses:  Cough  Rib pain    New Prescriptions Discharge Medication List as of 04/21/2017  5:29 PM    START taking these  medications   Details  albuterol (PROVENTIL HFA;VENTOLIN HFA) 108 (90 Base) MCG/ACT inhaler Inhale 1-2 puffs into the lungs every 6 (six) hours as needed for wheezing or shortness of breath., Starting Sat 04/21/2017, Print    benzonatate (TESSALON) 100 MG capsule Take 1 capsule (100 mg total) by mouth every 8 (eight) hours., Starting Sat 04/21/2017, Print         Doristine Devoid, PA-C 04/22/17 Jacqulyn Liner, MD 04/22/17 [phone]

## 2018-08-15 ENCOUNTER — Other Ambulatory Visit: Payer: Self-pay | Admitting: Nurse Practitioner

## 2018-08-15 DIAGNOSIS — Z1231 Encounter for screening mammogram for malignant neoplasm of breast: Secondary | ICD-10-CM

## 2018-09-18 ENCOUNTER — Ambulatory Visit
Admission: RE | Admit: 2018-09-18 | Discharge: 2018-09-18 | Disposition: A | Discharge status: Home / Self Care | Payer: Medicare Other | Source: Ambulatory Visit | Attending: Nurse Practitioner | Admitting: Nurse Practitioner

## 2018-09-18 DIAGNOSIS — Z1231 Encounter for screening mammogram for malignant neoplasm of breast: Secondary | ICD-10-CM

## 2018-09-20 ENCOUNTER — Other Ambulatory Visit: Payer: Self-pay | Admitting: Nurse Practitioner

## 2018-09-20 DIAGNOSIS — R921 Mammographic calcification found on diagnostic imaging of breast: Secondary | ICD-10-CM

## 2018-09-23 ENCOUNTER — Ambulatory Visit
Admission: RE | Admit: 2018-09-23 | Discharge: 2018-09-23 | Disposition: A | Discharge status: Home / Self Care | Payer: Medicare Other | Source: Ambulatory Visit | Attending: Nurse Practitioner | Admitting: Nurse Practitioner

## 2018-09-23 ENCOUNTER — Other Ambulatory Visit: Payer: Self-pay | Admitting: Nurse Practitioner

## 2018-09-23 DIAGNOSIS — R921 Mammographic calcification found on diagnostic imaging of breast: Secondary | ICD-10-CM

## 2018-09-26 ENCOUNTER — Ambulatory Visit
Admission: RE | Admit: 2018-09-26 | Discharge: 2018-09-26 | Disposition: A | Discharge status: Home / Self Care | Payer: Medicare Other | Source: Ambulatory Visit | Attending: Nurse Practitioner | Admitting: Nurse Practitioner

## 2018-09-26 DIAGNOSIS — R921 Mammographic calcification found on diagnostic imaging of breast: Secondary | ICD-10-CM

## 2018-12-18 ENCOUNTER — Encounter: Payer: Self-pay | Admitting: Internal Medicine

## 2018-12-20 ENCOUNTER — Ambulatory Visit (AMBULATORY_SURGERY_CENTER): Payer: Self-pay | Admitting: *Deleted

## 2018-12-20 ENCOUNTER — Other Ambulatory Visit: Payer: Self-pay

## 2018-12-20 VITALS — Ht 61.0 in | Wt 213.4 lb

## 2018-12-20 DIAGNOSIS — Z860100 Personal history of colon polyps, unspecified: Secondary | ICD-10-CM

## 2018-12-20 NOTE — Progress Notes (Signed)
No egg or soy allergy known to patient  No issues with past sedation with any surgeries  or procedures, no intubation problems  No diet pills per patient No home 02 use per patient  No blood thinners per patient  Pt denies issues with constipation  No A fib or A flutter  EMMI video offered and declined by the patient. 

## 2018-12-30 ENCOUNTER — Ambulatory Visit (AMBULATORY_SURGERY_CENTER): Payer: Medicare Other | Admitting: Internal Medicine

## 2018-12-30 ENCOUNTER — Encounter: Payer: Self-pay | Admitting: Internal Medicine

## 2018-12-30 VITALS — BP 158/78 | HR 77 | Temp 99.5°F | Resp 14 | Ht 61.0 in | Wt 213.0 lb

## 2018-12-30 DIAGNOSIS — K635 Polyp of colon: Secondary | ICD-10-CM

## 2018-12-30 DIAGNOSIS — Z86010 Personal history of colonic polyps: Secondary | ICD-10-CM

## 2018-12-30 DIAGNOSIS — D124 Benign neoplasm of descending colon: Secondary | ICD-10-CM

## 2018-12-30 MED ORDER — SODIUM CHLORIDE 0.9 % IV SOLN: 500.0000 mL | Freq: Once | INTRAVENOUS | Status: DC

## 2018-12-30 NOTE — Progress Notes (Signed)
Pt's states no medical or surgical changes since previsit or office visit. 

## 2018-12-30 NOTE — Patient Instructions (Addendum)
I found and removed one tiny polyp.  I will let you know pathology results and when to have another routine colonoscopy by mail and/or My Chart.  I appreciate the opportunity to care for you. Gatha Mayer, MD, [facility]  Polyp handout given to patient. Resume previous diet. Continue present medications.  Repeat colonoscopy recommended.  Date to be determined after pathology results reviewed.  YOU HAD AN ENDOSCOPIC PROCEDURE TODAY AT [facility]:   Refer to the procedure report that was given to you for any specific questions about what was found during the examination.  If the procedure report does not answer your questions, please call your gastroenterologist to clarify.  If you requested that your care partner not be given the details of your procedure findings, then the procedure report has been included in a sealed envelope for you to review at your convenience later.  YOU SHOULD EXPECT: Some feelings of bloating in the abdomen. Passage of more gas than usual.  Walking can help get rid of the air that was put into your GI tract during the procedure and reduce the bloating. If you had a lower endoscopy (such as a colonoscopy or flexible sigmoidoscopy) you may notice spotting of blood in your stool or on the toilet paper. If you underwent a bowel prep for your procedure, you may not have a normal bowel movement for a few days.  Please Note:  You might notice some irritation and congestion in your nose or some drainage.  This is from the oxygen used during your procedure.  There is no need for concern and it should clear up in a day or so.  SYMPTOMS TO REPORT IMMEDIATELY:   Following lower endoscopy (colonoscopy or flexible sigmoidoscopy):  Excessive amounts of blood in the stool  Significant tenderness or worsening of abdominal pains  Swelling of the abdomen that is new, acute  Fever of 100F or higher For urgent or emergent issues, a gastroenterologist can be  reached at any hour by calling [phone].   DIET:  We do recommend a small meal at first, but then you may proceed to your regular diet.  Drink plenty of fluids but you should avoid alcoholic beverages for 24 hours.  ACTIVITY:  You should plan to take it easy for the rest of today and you should NOT DRIVE or use heavy machinery until tomorrow (because of the sedation medicines used during the test).    FOLLOW UP: Our staff will call the number listed on your records the next business day following your procedure to check on you and address any questions or concerns that you may have regarding the information given to you following your procedure. If we do not reach you, we will leave a message.  However, if you are feeling well and you are not experiencing any problems, there is no need to return our call.  We will assume that you have returned to your regular daily activities without incident.  If any biopsies were taken you will be contacted by phone or by letter within the next 1-3 weeks.  Please call us at [phone] if you have not heard about the biopsies in 3 weeks.    SIGNATURES/CONFIDENTIALITY: You and/or your care partner have signed paperwork which will be entered into your electronic medical record.  These signatures attest to the fact that that the information above on your After Visit Summary has been reviewed and is understood.  Full responsibility of the confidentiality  of this discharge information lies with you and/or your care-partner.

## 2018-12-30 NOTE — Progress Notes (Signed)
To PACU, VSS. Report to [email or web address] 

## 2018-12-30 NOTE — Op Note (Signed)
Becky Camacho: Becky Camacho Procedure Date: 12/30/2018 1:22 PM MRN: [id number] Endoscopist: Gatha Mayer , MD Age: 58 Referring MD:  Date of Birth: [birth year] Gender: Female Account #: [id number] Procedure:                Colonoscopy Indications:              Surveillance: Personal history of adenomatous                            polyps on last colonoscopy > 3 years ago Medicines:                Propofol per Anesthesia, Monitored Anesthesia Care Procedure:                Pre-Anesthesia Assessment:                           - Prior to the procedure, a History and Physical                            was performed, and patient medications and                            allergies were reviewed. The patient's tolerance of                            previous anesthesia was also reviewed. The risks                            and benefits of the procedure and the sedation                            options and risks were discussed with the patient.                            All questions were answered, and informed consent                            was obtained. Prior Anticoagulants: The patient has                            taken no previous anticoagulant or antiplatelet                            agents. ASA Grade Assessment: III - A patient with                            severe systemic disease. After reviewing the risks                            and benefits, the patient was deemed in                            satisfactory condition to undergo the procedure.  After obtaining informed consent, the colonoscope                            was passed under direct vision. Throughout the                            procedure, the patient's blood pressure, pulse, and                            oxygen saturations were monitored continuously. The                            Colonoscope was introduced through the anus and   advanced to the the cecum, identified by                            appendiceal orifice and ileocecal valve. The                            colonoscopy was somewhat difficult due to                            significant looping. Successful completion of the                            procedure was aided by applying abdominal pressure.                            The patient tolerated the procedure well. The                            quality of the bowel preparation was adequate. The                            bowel preparation used was Miralax. The ileocecal                            valve, appendiceal orifice, and rectum were                            photographed. Scope In: 1:31:33 PM Scope Out: 1:54:55 PM Scope Withdrawal Time: 0 hours 14 minutes 47 seconds  Total Procedure Duration: 0 hours 23 minutes 22 seconds  Findings:                 A 5 mm polyp was found in the descending colon. The                            polyp was sessile. The polyp was removed with a                            cold snare. Resection and retrieval were complete.  Verification of patient identification for the                            specimen was done. Estimated blood loss was minimal.                           The exam was otherwise without abnormality on                            direct and retroflexion views. Complications:            No immediate complications. Estimated Blood Loss:     Estimated blood loss was minimal. Impression:               - One 5 mm polyp in the descending colon, removed                            with a cold snare. Resected and retrieved.                           - The examination was otherwise normal on direct                            and retroflexion views.                           - Personal history of colonic polyps. 11 ssa/p and                            2 adenomas since 2013 Recommendation:           - Patient has a contact number  available for                            emergencies. The signs and symptoms of potential                            delayed complications were discussed with the                            patient. Return to normal activities tomorrow.                            Written discharge instructions were provided to the                            patient.                           - Resume previous diet.                           - Continue present medications.                           - Repeat colonoscopy is recommended. The  colonoscopy date will be determined after pathology                            results from today's exam become available for                            review. Gatha Mayer, MD 12/30/2018 2:06:13 PM This report has been signed electronically.

## 2018-12-31 ENCOUNTER — Telehealth: Payer: Self-pay

## 2018-12-31 NOTE — Telephone Encounter (Signed)
  Follow up Call-  Call back number 12/30/2018  Post procedure Call Back phone  # [phone]  Permission to leave phone message Yes  Some recent data might be hidden     Patient questions:  Do you have a fever, pain , or abdominal swelling? No. Pain Score  0 *  Have you tolerated food without any problems? Yes.    Have you been able to return to your normal activities? Yes.    Do you have any questions about your discharge instructions: Diet   No. Medications  No. Follow up visit  No.  Do you have questions or concerns about your Care? No.  Actions: * If pain score is 4 or above: No action needed, pain <4.  No problems noted per pt. maw

## 2019-01-03 ENCOUNTER — Encounter: Payer: Self-pay | Admitting: Internal Medicine

## 2019-01-03 DIAGNOSIS — Z86010 Personal history of colonic polyps: Secondary | ICD-10-CM

## 2019-01-03 NOTE — Progress Notes (Signed)
5 mm HPP descending w/ hx multiple serrated polyps in past Recall 2025

## 2019-08-07 IMAGING — MG STEREOTACTIC VACUUM ASSIST RIGHT
8 of 10 series · 8 of 14 positions shown · non-contrast
Comparison: Previous exams.

ADDENDUM:
Pathology revealed FIBROADENOMA WITH CALCIFICATIONS of the Right
breast, upper outer quadrant. This was found to be concordant by Dr.
Wada Waba.

Pathology results were discussed with the patient by telephone. The
patient reported doing well after the biopsy with tenderness at the
site. Post biopsy instructions and care were reviewed and questions
were answered. The patient was encouraged to call The [REDACTED]
The patient was instructed to return for annual screening
mammography and informed a reminder notice would be sent regarding
this appointment.
Pathology results reported by Sgjepan Tovernic, RN on 09/27/2018.
CLINICAL DATA: Stereotactic biopsy was recommended of
calcifications in the upper slightly outer right breast.
EXAM:
RIGHT BREAST STEREOTACTIC CORE NEEDLE BIOPSY

[R (1 of 4)]
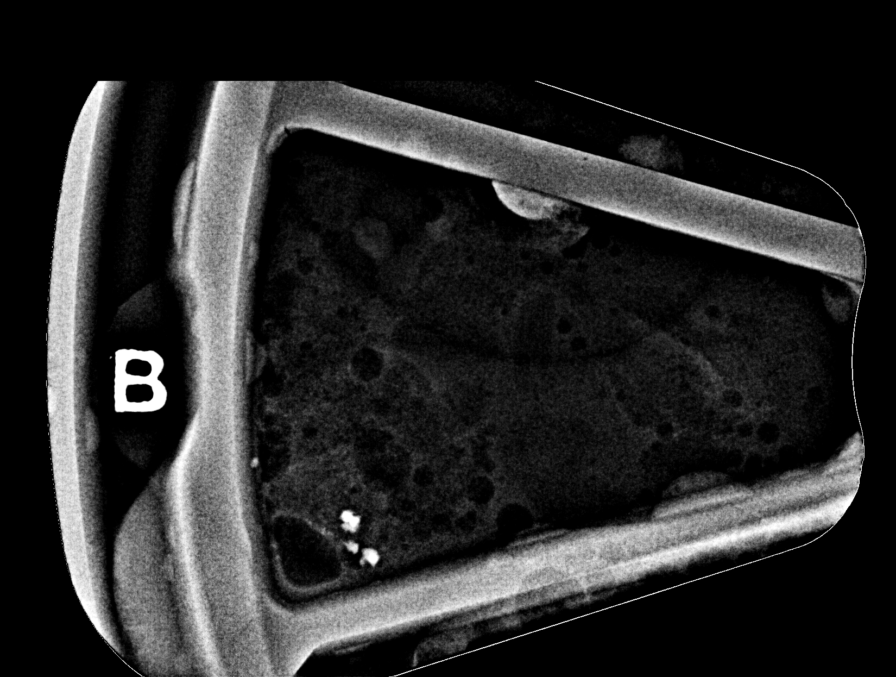

[R (2 of 4)]
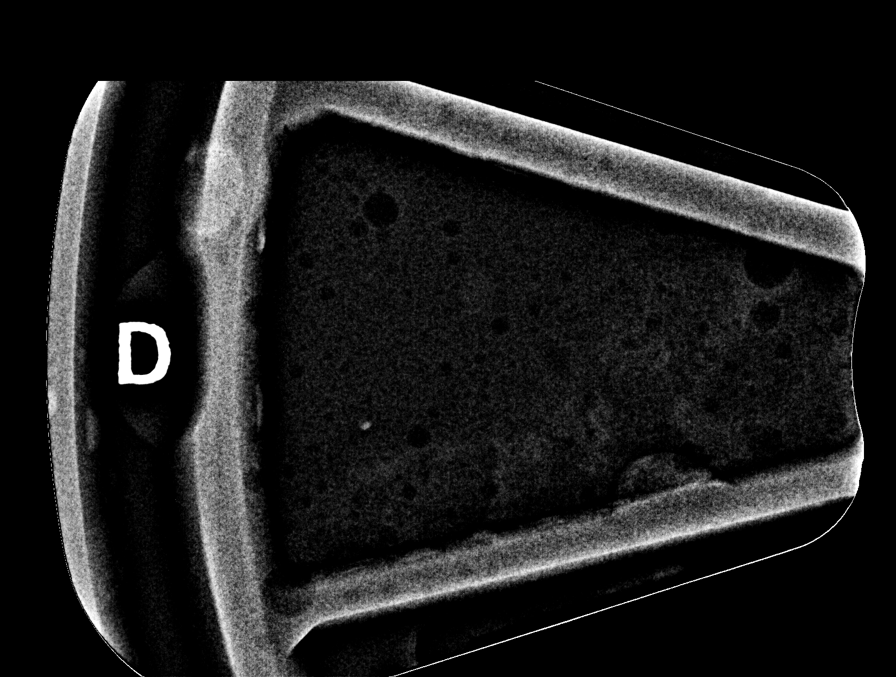

[R (3 of 4)]
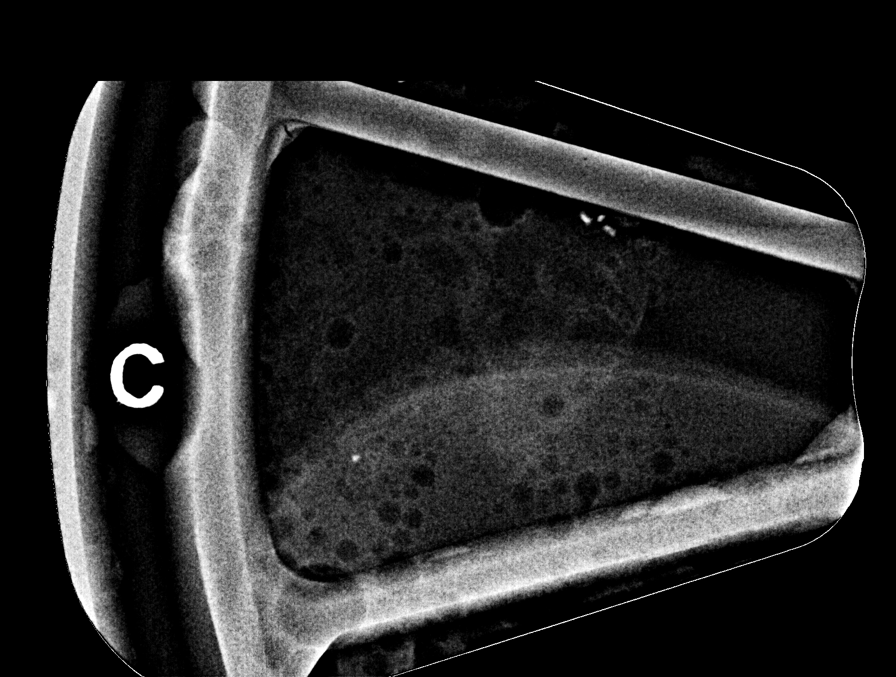

[R (4 of 4)]
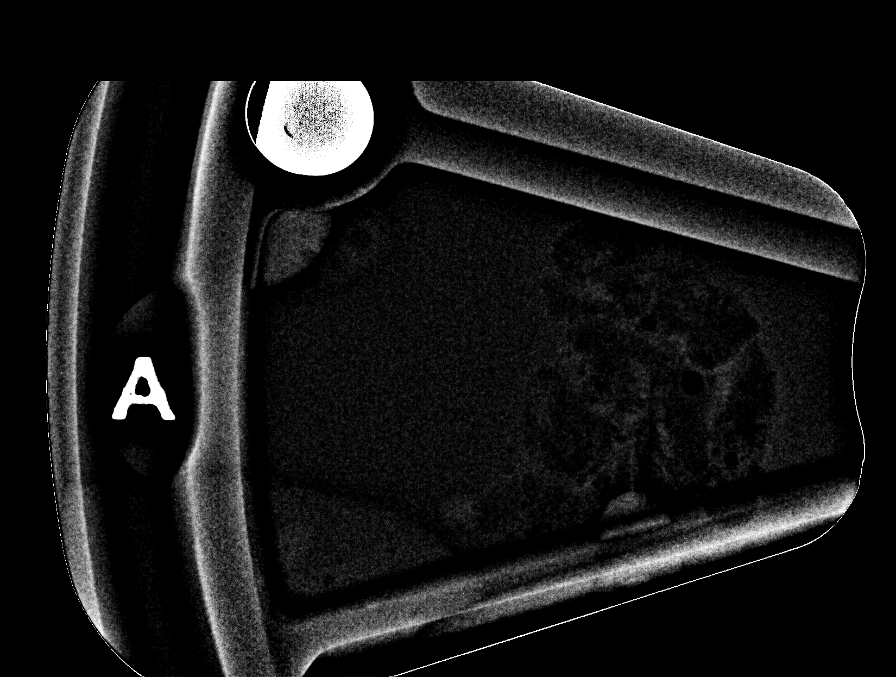

[R CC (1 of 4)]
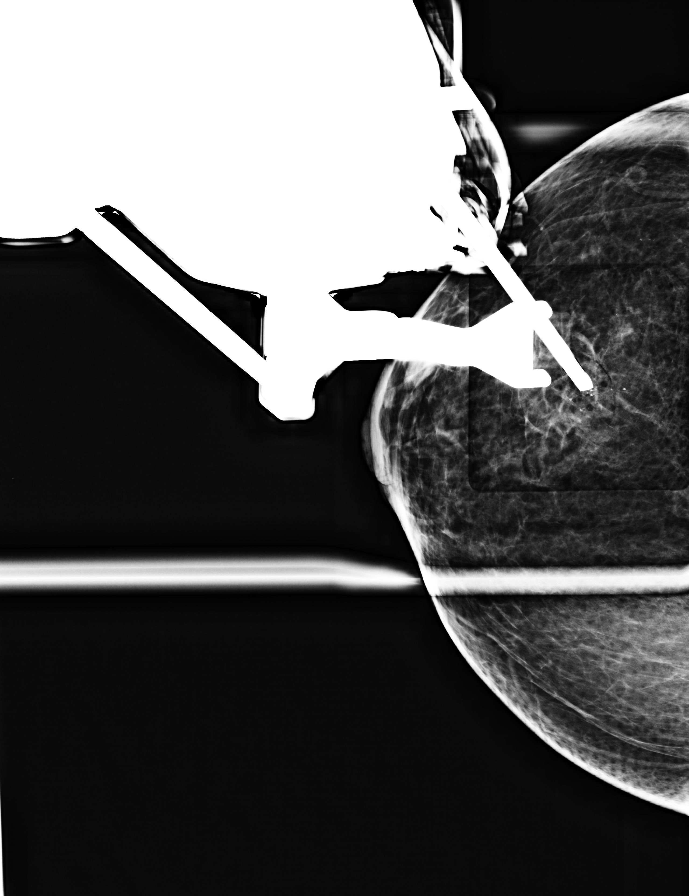

[R CC (2 of 4)]
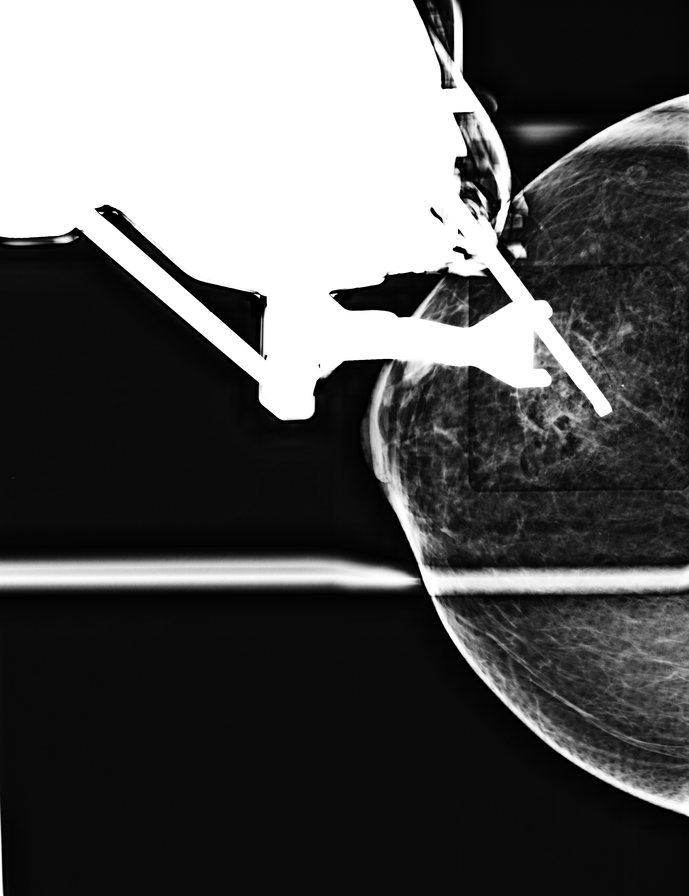

[R CC (3 of 4)]
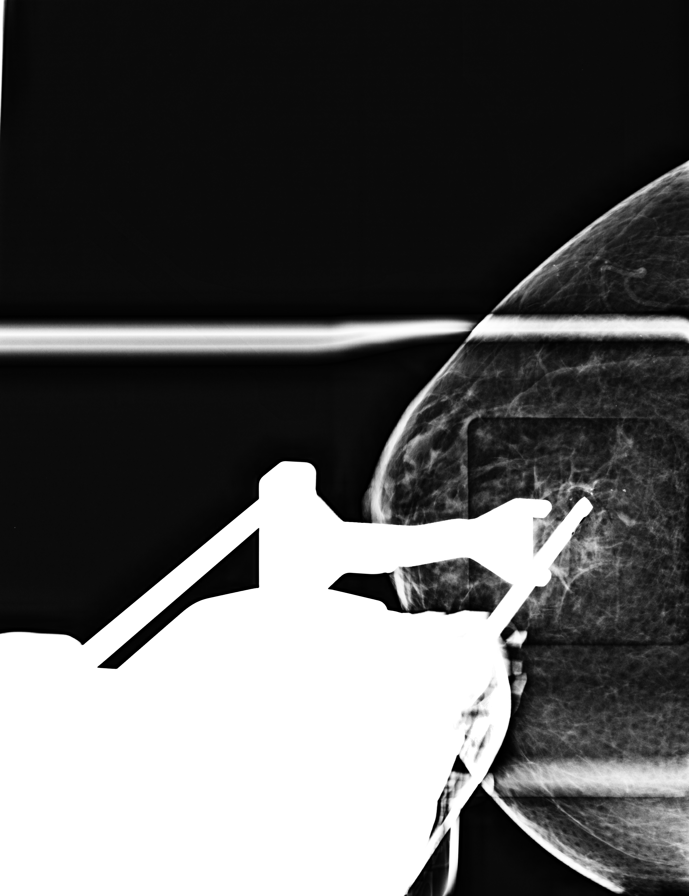

[R CC (4 of 4)]
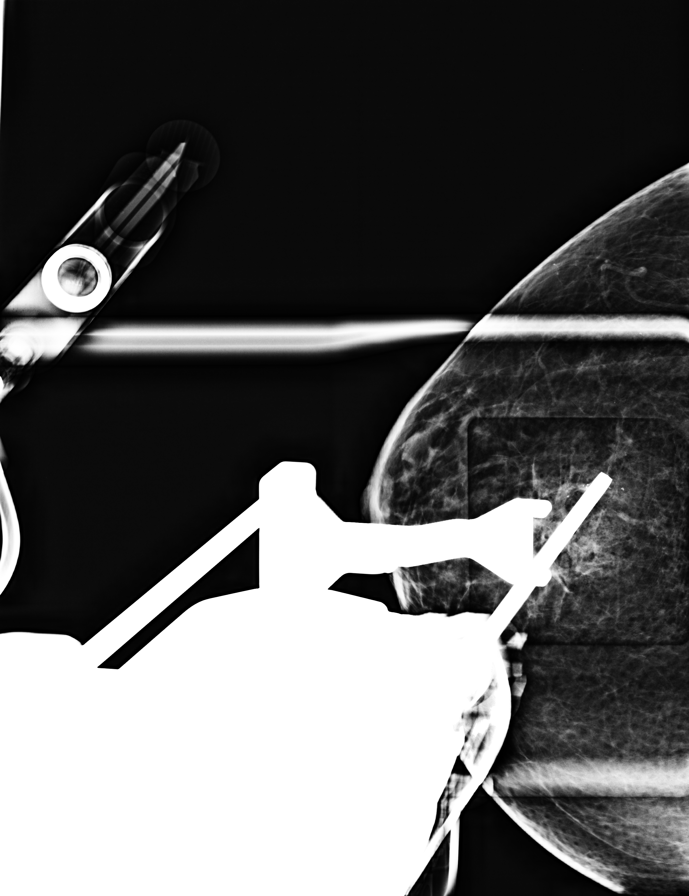

[8 of 14 positions shown; findings below may reference images not displayed]



Using sterile technique and 1% Lidocaine with and without
epinephrine as local anesthetic, under stereotactic guidance, a 9
gauge vacuum assisted device was used to perform core needle biopsy
of calcifications in the upper and slightly outer right breast using
a superior approach. Specimen radiograph was performed showing
multiple calcifications. Specimens with calcifications are
identified for pathology.

Lesion quadrant: Upper outer quadrant

At the conclusion of the procedure, a coil tissue marker clip was
deployed into the biopsy cavity. Follow-up 2-view mammogram was
performed and dictated separately.
IMPRESSION: Stereotactic-guided biopsy of the right breast. No apparent
complications.

## 2019-09-26 ENCOUNTER — Other Ambulatory Visit: Payer: Self-pay | Admitting: Internal Medicine

## 2019-09-26 DIAGNOSIS — Z1231 Encounter for screening mammogram for malignant neoplasm of breast: Secondary | ICD-10-CM

## 2019-11-17 ENCOUNTER — Ambulatory Visit: Payer: Medicare Other

## 2020-03-23 ENCOUNTER — Other Ambulatory Visit: Payer: Self-pay | Admitting: Family Medicine

## 2020-03-23 DIAGNOSIS — Z1231 Encounter for screening mammogram for malignant neoplasm of breast: Secondary | ICD-10-CM

## 2020-03-25 ENCOUNTER — Ambulatory Visit
Admission: RE | Admit: 2020-03-25 | Discharge: 2020-03-25 | Disposition: A | Discharge status: Home / Self Care | Payer: Medicare Other | Source: Ambulatory Visit | Attending: Family Medicine | Admitting: Family Medicine

## 2020-03-25 ENCOUNTER — Other Ambulatory Visit: Payer: Self-pay

## 2020-03-25 DIAGNOSIS — Z1231 Encounter for screening mammogram for malignant neoplasm of breast: Secondary | ICD-10-CM

## 2020-06-23 ENCOUNTER — Other Ambulatory Visit: Payer: Self-pay | Admitting: Family Medicine

## 2020-06-23 ENCOUNTER — Ambulatory Visit
Admission: RE | Admit: 2020-06-23 | Discharge: 2020-06-23 | Disposition: A | Discharge status: Home / Self Care | Payer: Medicare Other | Source: Ambulatory Visit | Attending: Family Medicine | Admitting: Family Medicine

## 2020-06-23 DIAGNOSIS — M25552 Pain in left hip: Secondary | ICD-10-CM

## 2021-03-18 ENCOUNTER — Other Ambulatory Visit: Payer: Self-pay | Admitting: Student in an Organized Health Care Education/Training Program

## 2021-03-18 ENCOUNTER — Emergency Department (HOSPITAL_COMMUNITY)
Admission: EM | Admit: 2021-03-18 | Discharge: 2021-03-18 | Disposition: A | Discharge status: Home / Self Care | Payer: Medicare Other | Attending: Emergency Medicine | Admitting: Emergency Medicine

## 2021-03-18 ENCOUNTER — Encounter (HOSPITAL_COMMUNITY): Payer: Self-pay | Admitting: *Deleted

## 2021-03-18 ENCOUNTER — Other Ambulatory Visit: Payer: Self-pay

## 2021-03-18 ENCOUNTER — Emergency Department (HOSPITAL_COMMUNITY): Payer: Medicare Other

## 2021-03-18 ENCOUNTER — Other Ambulatory Visit: Payer: Medicare Other

## 2021-03-18 DIAGNOSIS — E119 Type 2 diabetes mellitus without complications: Secondary | ICD-10-CM | POA: Diagnosis not present

## 2021-03-18 DIAGNOSIS — J449 Chronic obstructive pulmonary disease, unspecified: Secondary | ICD-10-CM | POA: Diagnosis not present

## 2021-03-18 DIAGNOSIS — M75102 Unspecified rotator cuff tear or rupture of left shoulder, not specified as traumatic: Secondary | ICD-10-CM | POA: Diagnosis not present

## 2021-03-18 DIAGNOSIS — E039 Hypothyroidism, unspecified: Secondary | ICD-10-CM | POA: Insufficient documentation

## 2021-03-18 DIAGNOSIS — I10 Essential (primary) hypertension: Secondary | ICD-10-CM | POA: Diagnosis not present

## 2021-03-18 DIAGNOSIS — Z7984 Long term (current) use of oral hypoglycemic drugs: Secondary | ICD-10-CM | POA: Diagnosis not present

## 2021-03-18 DIAGNOSIS — Z7982 Long term (current) use of aspirin: Secondary | ICD-10-CM | POA: Insufficient documentation

## 2021-03-18 DIAGNOSIS — F17210 Nicotine dependence, cigarettes, uncomplicated: Secondary | ICD-10-CM | POA: Insufficient documentation

## 2021-03-18 DIAGNOSIS — M25512 Pain in left shoulder: Secondary | ICD-10-CM

## 2021-03-18 DIAGNOSIS — J45909 Unspecified asthma, uncomplicated: Secondary | ICD-10-CM | POA: Insufficient documentation

## 2021-03-18 DIAGNOSIS — Z79899 Other long term (current) drug therapy: Secondary | ICD-10-CM | POA: Diagnosis not present

## 2021-03-18 DIAGNOSIS — M67912 Unspecified disorder of synovium and tendon, left shoulder: Secondary | ICD-10-CM

## 2021-03-18 LAB — URINALYSIS, ROUTINE W REFLEX MICROSCOPIC
Bilirubin Urine: NEGATIVE
Glucose, UA: NEGATIVE mg/dL
Hgb urine dipstick: NEGATIVE
Ketones, ur: NEGATIVE mg/dL
Leukocytes,Ua: NEGATIVE
Nitrite: NEGATIVE
Protein, ur: 30 mg/dL — AB
Specific Gravity, Urine: >1.03 — ABNORMAL HIGH (ref 1.005–1.030)
pH: 5.5 (ref 5.0–8.0)

## 2021-03-18 LAB — URINALYSIS, MICROSCOPIC (REFLEX)

## 2021-03-18 MED ORDER — LIDOCAINE HCL (PF) 1 % IJ SOLN
5.0000 mL | Freq: Once | INTRAMUSCULAR | Status: AC
  Administered 2021-03-18: 5 mL
  Filled 2021-03-18: qty 5

## 2021-03-18 MED ORDER — TRIAMCINOLONE ACETONIDE 40 MG/ML IJ SUSP
40.0000 mg | Freq: Once | INTRAMUSCULAR | Status: AC
  Administered 2021-03-18: 40 mg via INTRA_ARTICULAR
  Filled 2021-03-18: qty 1

## 2021-03-18 NOTE — ED Triage Notes (Signed)
Pt c/o lt shoulder pain for 2 weeks no injury

## 2021-03-18 NOTE — ED Triage Notes (Signed)
Emergency Medicine Provider Triage Evaluation Note  Becky Camacho , a 60 y.o. female  was evaluated in triage.  Pt complains of pain to her left arm.  Pain has been present for the last 2 weeks.  Pain has been unchanged over this time period.  Patient's pain starts in her left trapezius muscle and radiates down into her arm all the way down to her fingertips.  Patient endorses tingling sensation complaints.  Patient denies any recent falls or injuries.  Patient saddle anesthesia, bowel or bladder dysfunction, numbness to extremities, headache, visual disturbance.  Patient also reports that she has been urinary urgency, decreased urinary output, and dysuria over the last 2 weeks.  Denies any fevers, chills, abdominal pain, nausea, vomiting  Patient is right dominant.  Review of Systems  Positive: Left arm pain Negative: saddle anesthesia, bowel or bladder dysfunction, numbness to extremities, headache, visual disturbance, fevers, chills, abdominal pain, nausea, vomiting  Physical Exam  Ht 5\' 2"  (1.575 m)   Wt 90.7 kg   BMI 36.58 kg/m  Gen:   Awake, no distress   HEENT:  Atraumatic  Resp:  Normal effort  Abd:   Nondistended, nontender  MSK:   Moves extremities without difficulty, +2 left radial pulse, motor and sensation intact in patient's left hand.  Patient has no bony tenderness to left shoulder.  Patient has full active motion of neck Neuro:  Speech clear   Medical Decision Making  Medically screening exam initiated at 4:50 PM.  Appropriate orders placed.  Hayla L Jacque was informed that the remainder of the evaluation will be completed by another provider, this initial triage assessment does not replace that evaluation, and the importance of remaining in the ED until their evaluation is complete.  Clinical Impression   The patient appears stable so that the remainder of the work up may be completed by another provider.     Loni Beckwith, [place] 03/18/21 1654

## 2021-03-18 NOTE — ED Provider Notes (Signed)
[facility] Provider Note   CSN: [id number] Arrival date & time: 03/18/21  1606     History Chief Complaint  Patient presents with  . Shoulder Pain    Becky Camacho is a 60 y.o. female.  The history is provided by the patient.  Shoulder Pain Location:  Shoulder Shoulder location:  L shoulder Pain details:    Quality:  Shooting   Radiates to:  L arm and L wrist   Severity:  Moderate   Onset quality:  Gradual   Duration:  2 weeks   Timing:  Constant   Progression:  Worsening Handedness:  Right-handed Dislocation: no   Prior injury to area:  No Relieved by:  Rest Worsened by:  Movement Ineffective treatments:  NSAIDs Associated symptoms: no back pain, no fever, no numbness, no swelling and no tingling        Past Medical History:  Diagnosis Date  . Anxiety   . Arthritis   . Asthma   . Bipolar disorder ([place])   . COPD (chronic obstructive pulmonary disease) ([place])   . Depression   . Diabetes mellitus   . GERD (gastroesophageal reflux disease)   . Hyperlipidemia   . Hypertension   . Hypothyroidism   . Personal history of colonic polyps - serrated adenomas 02/14/2012   10 serrated adenomas max 8 mm removed 01/2012 Carlean Purl     Patient Active Problem List   Diagnosis Date Noted  . Personal history of colonic polyps - serrated adenomas 02/14/2012    Past Surgical History:  Procedure Laterality Date  . [place]  . COLONOSCOPY    . LUMBAR FUSION  2004  . POLYPECTOMY       OB History   No obstetric history on file.     Family History  Problem Relation Age of Onset  . Heart disease Father   . Colon cancer Paternal Uncle 42  . Stomach cancer Neg Hx   . Colon polyps Neg Hx   . Esophageal cancer Neg Hx   . Rectal cancer Neg Hx     Social History   Tobacco Use  . Smoking status: Current Every Day Smoker    Packs/day: 0.50    Types: Cigarettes  . Smokeless tobacco: Never Used  Vaping Use   . Vaping Use: Never used  Substance Use Topics  . Alcohol use: No  . Drug use: No    Home Medications Prior to Admission medications   Medication Sig Start Date End Date Taking? Authorizing Provider  albuterol (PROVENTIL HFA;VENTOLIN HFA) 108 (90 Base) MCG/ACT inhaler Inhale 1-2 puffs into the lungs every 6 (six) hours as needed for wheezing or shortness of breath. 04/21/17   Leaphart, Zack Seal, PA-C  aspirin 81 MG tablet Take 81 mg by mouth daily.     [provider]  Cholecalciferol (D3-1000 PO) Take by mouth daily.    [provider]  cyclobenzaprine (FLEXERIL) 5 MG tablet Take 5 mg by mouth 2 (two) times daily as needed.    [provider]  diclofenac (VOLTAREN) 75 MG EC tablet Take 75 mg by mouth 2 (two) times daily.    [provider]  divalproex (DEPAKOTE ER) 250 MG 24 hr tablet Take 250 mg by mouth daily.    [provider]  divalproex (DEPAKOTE ER) 500 MG 24 hr tablet Take 500 mg by mouth daily. Takes 2 caps at bedtime.    [provider]  FLUoxetine (  PROZAC) 40 MG capsule  09/21/15   [provider]  glyBURIDE-metformin (GLUCOVANCE) 5-500 MG per tablet Take 2 tablets by mouth 2 (two) times daily with a meal.     [provider]  hydrochlorothiazide (HYDRODIURIL) 12.5 MG tablet Take 12.5 mg by mouth daily. 11/28/18   [provider]  levothyroxine (SYNTHROID, LEVOTHROID) 175 MCG tablet  09/22/15   [provider]  montelukast (SINGULAIR) 10 MG tablet Take 10 mg by mouth every evening. 10/20/18   [provider]  omeprazole (PRILOSEC) 40 MG capsule Take 40 mg by mouth daily.    [provider]  pentoxifylline (TRENTAL) 400 MG CR tablet TAKE 1 TABLET BY MOUTH TWICE DAILY WITH MEALS FOR 30 DAYS 12/11/18   [provider]  risperiDONE (RISPERDAL) 1 MG tablet Take 1 mg by mouth at bedtime.    [provider]  simvastatin (ZOCOR) 40 MG tablet Take 40 mg by mouth at  bedtime.    [provider]  sitaGLIPtin (JANUVIA) 100 MG tablet Take 100 mg by mouth daily.    [provider]  traZODone (DESYREL) 100 MG tablet Take 100 mg by mouth at bedtime. Takes 1.5 tablets at bedtime    [provider]    Allergies    Codeine, Niacin and related, and Penicillins  Review of Systems   Review of Systems  Constitutional: Negative for chills and fever.  HENT: Negative for ear pain and sore throat.   Eyes: Negative for pain and visual disturbance.  Respiratory: Negative for cough and shortness of breath.   Cardiovascular: Negative for chest pain and palpitations.  Gastrointestinal: Negative for abdominal pain and vomiting.  Genitourinary: Negative for dysuria and hematuria.  Musculoskeletal: Negative for arthralgias and back pain.  Skin: Negative for color change and rash.  Neurological: Negative for seizures and syncope.  All other systems reviewed and are negative.   Physical Exam Updated Vital Signs BP (!) 134/97   Pulse 84   Temp 98.5 F (36.9 C) (Oral)   Resp 18   Ht 5\' 2"  (1.575 m)   Wt 90.7 kg   SpO2 98%   BMI 36.58 kg/m   Physical Exam Vitals and nursing note reviewed.  Constitutional:      General: She is not in acute distress.    Appearance: She is well-developed.  HENT:     Head: Normocephalic and atraumatic.  Eyes:     Conjunctiva/sclera: Conjunctivae normal.  Neck:     Comments: Limited cervical spine extension. Neck motion does not reproduce the pain. Cardiovascular:     Rate and Rhythm: Normal rate and regular rhythm.     Heart sounds: No murmur heard.   Pulmonary:     Effort: Pulmonary effort is normal. No respiratory distress.     Breath sounds: Normal breath sounds.  Musculoskeletal:     Cervical back: Neck supple.     Comments: The left shoulder is normal to inspection.  Abduction and flexion are limited secondary to pain.  No weakness.  Impingement tests are positive.  Distal pulses normal.   Sensation normal.  Skin:    General: Skin is warm and dry.  Neurological:     General: No focal deficit present.     Mental Status: She is alert and oriented to person, place, and time.  Psychiatric:        Mood and Affect: Mood normal.     ED Results / Procedures / Treatments   Labs (all labs ordered are listed,  but only abnormal results are displayed) Labs Reviewed  URINALYSIS, ROUTINE W REFLEX MICROSCOPIC - Abnormal; Notable for the following components:      Result Value   Specific Gravity, Urine >1.030 (*)    Protein, ur 30 (*)    All other components within normal limits  URINALYSIS, MICROSCOPIC (REFLEX) - Abnormal; Notable for the following components:   Bacteria, UA RARE (*)    All other components within normal limits    EKG None  Radiology DG Cervical Spine Complete  Result Date: 03/18/2021 CLINICAL DATA:  Left shoulder and neck pain EXAM: CERVICAL SPINE - COMPLETE 4+ VIEW COMPARISON:  None. FINDINGS: Straightening of the cervical spine. Vertebral body heights are maintained. The disc spaces are patent. Foramen appear grossly patent. Lateral masses are partially obscured by teeth. IMPRESSION: Straightening of the cervical spine. No acute osseous abnormality. Electronically Signed   By: Donavan Foil M.D.   On: 03/18/2021 18:16   DG Shoulder Left  Result Date: 03/18/2021 CLINICAL DATA:  Two weeks of worsening left shoulder pain history of trauma. EXAM: LEFT SHOULDER - 2+ VIEW COMPARISON:  Chest radiograph November 05, 2012. FINDINGS: There is no evidence of fracture or dislocation. There is no evidence of arthropathy or other focal bone abnormality. Soft tissues are unremarkable. Visualized lung fields are clear. IMPRESSION: No acute osseous abnormality. Electronically Signed   By: Dahlia Bailiff MD   On: 03/18/2021 22:04    Procedures .Joint Aspiration/Arthrocentesis  Date/Time: 03/18/2021 10:44 PM Performed by: Arnaldo Natal, MD Authorized by: Arnaldo Natal, MD    Consent:    Consent obtained:  Verbal   Consent given by:  Patient   Risks, benefits, and alternatives were discussed: yes     Risks discussed:  Bleeding, infection and pain   Alternatives discussed:  No treatment, delayed treatment, alternative treatment and referral Universal protocol:    Immediately prior to procedure, a time out was called: yes     Patient identity confirmed:  Verbally with patient Location:    Location:  Shoulder   Shoulder:  L subacromial bursa Anesthesia:    Anesthesia method:  None Procedure details:    Preparation: Patient was prepped and draped in usual sterile fashion     Needle gauge:  22 G   Ultrasound guidance: yes     Approach:  Posterior   Steroid injected: yes   Post-procedure details:    Dressing: none.   Procedure completion:  Tolerated well, no immediate complications     Medications Ordered in ED Medications  lidocaine (PF) (XYLOCAINE) 1 % injection 5 mL (has no administration in time range)  triamcinolone acetonide (KENALOG-40) injection 40 mg (has no administration in time range)    ED Course  I have reviewed the triage vital signs and the nursing notes.  Pertinent labs & imaging results that were available during my care of the patient were reviewed by me and considered in my medical decision making (see chart for details).    MDM Rules/Calculators/A&P                          Deniesha L Rau presents with left arm pain.  Pain has been persistent for 2 weeks.  While I did consider acute coronary syndrome or pain referred from the chest, pain is clearly worse with movement of the arm.  I also considered radicular pain, but the patient has more symptoms that localized to the rotator cuff itself.  She  was provided with a rotator cuff injection and referred to orthopedics.  Final Clinical Impression(s) / ED Diagnoses Final diagnoses:  Rotator cuff disorder, left    Rx / DC Orders ED Discharge Orders    None       Arnaldo Natal, MD 03/18/21 2245

## 2021-04-20 ENCOUNTER — Other Ambulatory Visit: Payer: Self-pay | Admitting: Student in an Organized Health Care Education/Training Program

## 2021-04-20 DIAGNOSIS — N289 Disorder of kidney and ureter, unspecified: Secondary | ICD-10-CM

## 2021-04-20 DIAGNOSIS — R300 Dysuria: Secondary | ICD-10-CM

## 2021-04-20 DIAGNOSIS — D72829 Elevated white blood cell count, unspecified: Secondary | ICD-10-CM

## 2021-05-03 ENCOUNTER — Other Ambulatory Visit: Payer: Self-pay | Admitting: Student in an Organized Health Care Education/Training Program

## 2021-05-03 ENCOUNTER — Ambulatory Visit
Admission: RE | Admit: 2021-05-03 | Discharge: 2021-05-03 | Disposition: A | Discharge status: Home / Self Care | Payer: Medicare Other | Source: Ambulatory Visit | Attending: Student in an Organized Health Care Education/Training Program | Admitting: Student in an Organized Health Care Education/Training Program

## 2021-05-03 DIAGNOSIS — Z1231 Encounter for screening mammogram for malignant neoplasm of breast: Secondary | ICD-10-CM

## 2021-05-03 DIAGNOSIS — N289 Disorder of kidney and ureter, unspecified: Secondary | ICD-10-CM

## 2021-05-03 DIAGNOSIS — D72829 Elevated white blood cell count, unspecified: Secondary | ICD-10-CM

## 2021-05-03 DIAGNOSIS — R300 Dysuria: Secondary | ICD-10-CM

## 2021-05-12 ENCOUNTER — Emergency Department (HOSPITAL_COMMUNITY): Payer: Medicare Other

## 2021-05-12 ENCOUNTER — Emergency Department (HOSPITAL_COMMUNITY)
Admission: EM | Admit: 2021-05-12 | Discharge: 2021-05-12 | Disposition: A | Discharge status: Home / Self Care | Payer: Medicare Other | Attending: Emergency Medicine | Admitting: Emergency Medicine

## 2021-05-12 ENCOUNTER — Other Ambulatory Visit: Payer: Self-pay

## 2021-05-12 ENCOUNTER — Encounter (HOSPITAL_COMMUNITY): Payer: Self-pay

## 2021-05-12 DIAGNOSIS — Z7982 Long term (current) use of aspirin: Secondary | ICD-10-CM | POA: Insufficient documentation

## 2021-05-12 DIAGNOSIS — Z20822 Contact with and (suspected) exposure to covid-19: Secondary | ICD-10-CM | POA: Diagnosis not present

## 2021-05-12 DIAGNOSIS — R0789 Other chest pain: Secondary | ICD-10-CM | POA: Diagnosis present

## 2021-05-12 DIAGNOSIS — J45909 Unspecified asthma, uncomplicated: Secondary | ICD-10-CM | POA: Diagnosis not present

## 2021-05-12 DIAGNOSIS — I10 Essential (primary) hypertension: Secondary | ICD-10-CM | POA: Insufficient documentation

## 2021-05-12 DIAGNOSIS — J449 Chronic obstructive pulmonary disease, unspecified: Secondary | ICD-10-CM | POA: Diagnosis not present

## 2021-05-12 DIAGNOSIS — D7389 Other diseases of spleen: Secondary | ICD-10-CM | POA: Insufficient documentation

## 2021-05-12 DIAGNOSIS — Z7984 Long term (current) use of oral hypoglycemic drugs: Secondary | ICD-10-CM | POA: Diagnosis not present

## 2021-05-12 DIAGNOSIS — F17210 Nicotine dependence, cigarettes, uncomplicated: Secondary | ICD-10-CM | POA: Insufficient documentation

## 2021-05-12 DIAGNOSIS — Z79899 Other long term (current) drug therapy: Secondary | ICD-10-CM | POA: Diagnosis not present

## 2021-05-12 DIAGNOSIS — E871 Hypo-osmolality and hyponatremia: Secondary | ICD-10-CM

## 2021-05-12 DIAGNOSIS — E119 Type 2 diabetes mellitus without complications: Secondary | ICD-10-CM | POA: Diagnosis not present

## 2021-05-12 DIAGNOSIS — E039 Hypothyroidism, unspecified: Secondary | ICD-10-CM | POA: Insufficient documentation

## 2021-05-12 LAB — URINALYSIS, COMPLETE (UACMP) WITH MICROSCOPIC
Bilirubin Urine: NEGATIVE
Glucose, UA: NEGATIVE mg/dL
Hgb urine dipstick: NEGATIVE
Ketones, ur: NEGATIVE mg/dL
Leukocytes,Ua: NEGATIVE
Nitrite: NEGATIVE
Protein, ur: 30 mg/dL — AB
Specific Gravity, Urine: 1.025 (ref 1.005–1.030)
WBC, UA: NONE SEEN WBC/hpf (ref 0–5)
pH: 6 (ref 5.0–8.0)

## 2021-05-12 LAB — COMPREHENSIVE METABOLIC PANEL
ALT: 10 U/L (ref 0–44)
AST: 13 U/L — ABNORMAL LOW (ref 15–41)
Albumin: 3.2 g/dL — ABNORMAL LOW (ref 3.5–5.0)
Alkaline Phosphatase: 57 U/L (ref 38–126)
Anion gap: 12 (ref 5–15)
BUN: 16 mg/dL (ref 8–23)
CO2: 21 mmol/L — ABNORMAL LOW (ref 22–32)
Calcium: 8.7 mg/dL — ABNORMAL LOW (ref 8.9–10.3)
Chloride: 94 mmol/L — ABNORMAL LOW (ref 98–111)
Creatinine, Ser: 1.09 mg/dL — ABNORMAL HIGH (ref 0.44–1.00)
GFR, Estimated: 57 mL/min — ABNORMAL LOW (ref >60)
Glucose, Bld: 49 mg/dL — ABNORMAL LOW (ref 70–99)
Potassium: 4.3 mmol/L (ref 3.5–5.1)
Sodium: 127 mmol/L — ABNORMAL LOW (ref 135–145)
Total Bilirubin: 0.5 mg/dL (ref 0.3–1.2)
Total Protein: 6.4 g/dL — ABNORMAL LOW (ref 6.5–8.1)

## 2021-05-12 LAB — CBC
HCT: 34.9 % — ABNORMAL LOW (ref 36.0–46.0)
Hemoglobin: 12.1 g/dL (ref 12.0–15.0)
MCH: 32.1 pg (ref 26.0–34.0)
MCHC: 34.7 g/dL (ref 30.0–36.0)
MCV: 92.6 fL (ref 80.0–100.0)
Platelets: 225 10*3/uL (ref 150–400)
RBC: 3.77 MIL/uL — ABNORMAL LOW (ref 3.87–5.11)
RDW: 13.1 % (ref 11.5–15.5)
WBC: 8.9 10*3/uL (ref 4.0–10.5)
nRBC: 0 % (ref 0.0–0.2)

## 2021-05-12 LAB — TROPONIN I (HIGH SENSITIVITY)
Troponin I (High Sensitivity): 4 ng/L (ref <18)
Troponin I (High Sensitivity): 5 ng/L (ref <18)

## 2021-05-12 LAB — CBG MONITORING, ED
Glucose-Capillary: 65 mg/dL — ABNORMAL LOW (ref 70–99)
Glucose-Capillary: 86 mg/dL (ref 70–99)
Glucose-Capillary: 80 mg/dL (ref 70–99)
Glucose-Capillary: 80 mg/dL (ref 70–99)

## 2021-05-12 LAB — MAGNESIUM: Magnesium: 1 mg/dL — ABNORMAL LOW (ref 1.7–2.4)

## 2021-05-12 MED ORDER — MAGNESIUM OXIDE 250 MG PO TABS: 1.0000 | ORAL_TABLET | Freq: Every day | ORAL | 0 refills | Status: AC

## 2021-05-12 MED ORDER — MAGNESIUM OXIDE -MG SUPPLEMENT 400 (240 MG) MG PO TABS
800.0000 mg | ORAL_TABLET | Freq: Once | ORAL | Status: AC
  Administered 2021-05-12: 800 mg via ORAL
  Filled 2021-05-12: qty 2

## 2021-05-12 NOTE — ED Triage Notes (Signed)
Pt c/o of intermittent CP x 28 days nothing makes it worse or better but c/o of having anxiety due to mother passing away also c/o of some right flank pain and recent dx of kidney stone

## 2021-05-12 NOTE — ED Notes (Signed)
Patient verbalized understanding of discharge instructions. Opportunity for questions and answers.  

## 2021-05-12 NOTE — ED Notes (Signed)
Pt given juice with MD approval and will recheck CBG

## 2021-05-12 NOTE — Discharge Instructions (Addendum)
The lab work and imaging that was done in the emergency department was largely reassuring. You are not having a heart attack. That does not mean you don't have heart disease. You will need to have further testing after today to determine if you have any heart disease.  It is likely that you are recovering from a pneumonia based on the imaging here. I do not think you need antibiotics now since you are no longer having cough or fever or shortness of breath.  Abnormalities that we found in the emergency department: -Low magnesium: I am prescribing you a magnesium supplement to take for the next 5 days. 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 If you cannot get an MRI then you will may need other types of imaging. Discuss this with your doctor.  Please call your doctor in the morning and schedule a follow up appointment to be seen within the next 7 days. You will need to discuss all of the results from the ED visit.

## 2021-05-12 NOTE — ED Provider Notes (Signed)
[facility] EMERGENCY DEPARTMENT Provider Note   CSN: [id number] Arrival date & time: 05/12/21  1709     History Chief Complaint  Patient presents with   Chest Pain    Becky Camacho is a 60 y.o. female.  The history is provided by the patient and medical records.  Chest Pain Pain location:  L chest Pain quality: aching   Pain radiates to:  Does not radiate Pain severity:  Mild Onset quality:  Gradual Duration:  1 month Timing:  Sporadic Progression:  Unchanged Chronicity:  New Context: not breathing, not drug use, not eating, not intercourse, not lifting, not movement, not raising an arm, not at rest, not stress and not trauma   Context comment:  Can occur spordically throughout the day whether she is resting or active Relieved by:  None tried Worsened by:  Nothing Ineffective treatments:  None tried Associated symptoms: no abdominal pain, no AICD problem, no altered mental status, no anorexia, no anxiety, no back pain, no claudication, no cough, no diaphoresis, no dizziness, no dysphagia, no fatigue, no fever, no headache, no heartburn, no lower extremity edema, no nausea, no near-syncope, no numbness, no orthopnea, no palpitations, no PND, no shortness of breath, no syncope, no vomiting and no weakness   Risk factors: diabetes mellitus, hypertension and smoking   Risk factors: no aortic disease, no birth control, no coronary artery disease, no Ehlers-Danlos syndrome, no Marfan's syndrome and no prior DVT/PE    Chest pain in the left chest, has been occurring sporadically at least once daily for 1 month.  Patient also mentions that she has been having intermittent right flank pain for the past month.  She recently had a renal ultrasound performed and says that it showed a kidney stone.  Denies fever, dysuria, hematuria but does say sometimes it is difficult to urinate.   HPI: A 60 year old patient with a history of treated diabetes, hypertension,  hypercholesterolemia and obesity presents for evaluation of chest pain. Initial onset of pain was more than 6 hours ago. The patient's chest pain is sharp and is not worse with exertion. The patient's chest pain is middle- or left-sided, is not well-localized, is not described as heaviness/pressure/tightness and does not radiate to the arms/jaw/neck. The patient does not complain of nausea and denies diaphoresis. The patient has smoked in the past 90 days. The patient has no history of stroke, has no history of peripheral artery disease and has no relevant family history of coronary artery disease (first degree relative at less than age 41).   Past Medical History:  Diagnosis Date   Anxiety    Arthritis    Asthma    Bipolar disorder ([place])    COPD (chronic obstructive pulmonary disease) ([place])    Depression    Diabetes mellitus    GERD (gastroesophageal reflux disease)    Hyperlipidemia    Hypertension    Hypothyroidism    Personal history of colonic polyps - serrated adenomas 02/14/2012   10 serrated adenomas max 8 mm removed 01/2012 Carlean Purl     Patient Active Problem List   Diagnosis Date Noted   Personal history of colonic polyps - serrated adenomas 02/14/2012    Past Surgical History:  Procedure Laterality Date   CESAREAN SECTION  1977, 1980   COLONOSCOPY     LUMBAR FUSION  2004   POLYPECTOMY       OB History   No obstetric history on file.     Family History  Problem Relation Age of Onset   Heart disease Father    Colon cancer Paternal Uncle 31   Stomach cancer Neg Hx    Colon polyps Neg Hx    Esophageal cancer Neg Hx    Rectal cancer Neg Hx     Social History   Tobacco Use   Smoking status: Every Day    Packs/day: 0.50    Pack years: 0.00    Types: Cigarettes   Smokeless tobacco: Never  Vaping Use   Vaping Use: Never used  Substance Use Topics   Alcohol use: No   Drug use: No    Home Medications Prior to Admission medications   Medication Sig Start  Date End Date Taking? Authorizing Provider  Magnesium Oxide 250 MG TABS Take 1 tablet (250 mg total) by mouth daily for 5 days. 05/12/21 05/17/21 Yes Pearson Grippe, DO  albuterol (PROVENTIL HFA;VENTOLIN HFA) 108 (90 Base) MCG/ACT inhaler Inhale 1-2 puffs into the lungs every 6 (six) hours as needed for wheezing or shortness of breath. 04/21/17   Leaphart, Zack Seal, PA-C  aspirin 81 MG tablet Take 81 mg by mouth daily.     [provider]  Cholecalciferol (D3-1000 PO) Take by mouth daily.    [provider]  cyclobenzaprine (FLEXERIL) 5 MG tablet Take 5 mg by mouth 2 (two) times daily as needed.    [provider]  diclofenac (VOLTAREN) 75 MG EC tablet Take 75 mg by mouth 2 (two) times daily.    [provider]  divalproex (DEPAKOTE ER) 250 MG 24 hr tablet Take 250 mg by mouth daily.    [provider]  divalproex (DEPAKOTE ER) 500 MG 24 hr tablet Take 500 mg by mouth daily. Takes 2 caps at bedtime.    [provider]  FLUoxetine (PROZAC) 40 MG capsule  09/21/15   [provider]  glyBURIDE-metformin (GLUCOVANCE) 5-500 MG per tablet Take 2 tablets by mouth 2 (two) times daily with a meal.     [provider]  hydrochlorothiazide (HYDRODIURIL) 12.5 MG tablet Take 12.5 mg by mouth daily. 11/28/18   [provider]  levothyroxine (SYNTHROID, LEVOTHROID) 175 MCG tablet  09/22/15   [provider]  montelukast (SINGULAIR) 10 MG tablet Take 10 mg by mouth every evening. 10/20/18   [provider]  omeprazole (PRILOSEC) 40 MG capsule Take 40 mg by mouth daily.    [provider]  pentoxifylline (TRENTAL) 400 MG CR tablet TAKE 1 TABLET BY MOUTH TWICE DAILY WITH MEALS FOR 30 DAYS 12/11/18   [provider]  risperiDONE (RISPERDAL) 1 MG tablet Take 1 mg by mouth at bedtime.    [provider]  simvastatin (ZOCOR) 40 MG tablet Take 40 mg by mouth at bedtime.    [provider]   sitaGLIPtin (JANUVIA) 100 MG tablet Take 100 mg by mouth daily.    [provider]  traZODone (DESYREL) 100 MG tablet Take 100 mg by mouth at bedtime. Takes 1.5 tablets at bedtime    [provider]    Allergies    Codeine, Niacin and related, and Penicillins  Review of Systems   Review of Systems  Constitutional:  Negative for diaphoresis, fatigue and fever.  HENT:  Negative for trouble swallowing.   Respiratory:  Negative for cough and shortness of breath.   Cardiovascular:  Positive for chest pain. Negative for palpitations, orthopnea, claudication, syncope, PND and near-syncope.  Gastrointestinal:  Negative for abdominal pain, anorexia, heartburn, nausea and  vomiting.  Musculoskeletal:  Negative for back pain.  Neurological:  Negative for dizziness, weakness, numbness and headaches.   Physical Exam Updated Vital Signs BP (!) 171/90   Pulse 74   Temp 97.7 F (36.5 C) (Oral)   Resp 18   Ht 5\' 2"  (1.575 m)   Wt 90.7 kg   SpO2 98%   BMI 36.57 kg/m   Physical Exam Vitals and nursing note reviewed.  Constitutional:      General: She is not in acute distress.    Appearance: She is well-developed. She is obese.  HENT:     Head: Normocephalic and atraumatic.  Eyes:     Conjunctiva/sclera: Conjunctivae normal.  Cardiovascular:     Rate and Rhythm: Normal rate and regular rhythm.     Pulses:          Radial pulses are 2+ on the right side and 2+ on the left side.       Dorsalis pedis pulses are 2+ on the right side and 2+ on the left side.     Heart sounds: No murmur heard. Pulmonary:     Effort: Pulmonary effort is normal. No respiratory distress.     Breath sounds: Normal breath sounds.  Abdominal:     Palpations: Abdomen is soft.     Tenderness: There is no abdominal tenderness.  Musculoskeletal:     Cervical back: Neck supple.     Right lower leg: No edema.     Left lower leg: No edema.  Skin:    General: Skin is warm and dry.  Neurological:      General: No focal deficit present.     Mental Status: She is alert.     GCS: GCS eye subscore is 4. GCS verbal subscore is 5. GCS motor subscore is 6.     Cranial Nerves: Cranial nerves are intact.    ED Results / Procedures / Treatments   Labs (all labs ordered are listed, but only abnormal results are displayed) Labs Reviewed  CBC - Abnormal; Notable for the following components:      Result Value   RBC 3.77 (*)    HCT 34.9 (*)    All other components within normal limits  COMPREHENSIVE METABOLIC PANEL - Abnormal; Notable for the following components:   Sodium 127 (*)    Chloride 94 (*)    CO2 21 (*)    Glucose, Bld 49 (*)    Creatinine, Ser 1.09 (*)    Calcium 8.7 (*)    Total Protein 6.4 (*)    Albumin 3.2 (*)    AST 13 (*)    GFR, Estimated 57 (*)    All other components within normal limits  MAGNESIUM - Abnormal; Notable for the following components:   Magnesium 1.0 (*)    All other components within normal limits  URINALYSIS, COMPLETE (UACMP) WITH MICROSCOPIC - Abnormal; Notable for the following components:   Protein, ur 30 (*)    Bacteria, UA RARE (*)    All other components within normal limits  CBG MONITORING, ED - Abnormal; Notable for the following components:   Glucose-Capillary 65 (*)    All other components within normal limits  RESP PANEL BY RT-PCR (FLU A&B, COVID) [id number]  CBG MONITORING, ED  CBG MONITORING, ED  CBG MONITORING, ED  TROPONIN I (HIGH SENSITIVITY)  TROPONIN I (HIGH SENSITIVITY)    EKG EKG Interpretation  Date/Time:  Thursday May 12 2021 17:17:58 EDT Ventricular Rate:  86 PR Interval:  157 QRS Duration: 90 QT Interval:  393 QTC Calculation: 471 R Axis:   13 Text Interpretation: Sinus rhythm Confirmed by Lajean Saver [phone]) on 05/12/2021 5:24:53 PM  Radiology CT ABDOMEN PELVIS WO CONTRAST  Result Date: 05/12/2021 CLINICAL DATA:  Flank pain EXAM: CT ABDOMEN AND PELVIS WITHOUT CONTRAST TECHNIQUE: Multidetector CT imaging of  the abdomen and pelvis was performed following the standard protocol without IV contrast. COMPARISON:  Ultrasound 05/03/2021 FINDINGS: Lower chest: Lung bases demonstrate patchy ground-glass density at the right middle lobe and bilateral bases. No pleural effusion. Coronary vascular calcification Hepatobiliary: No focal liver abnormality is seen. No gallstones, gallbladder wall thickening, or biliary dilatation. Pancreas: Unremarkable. No pancreatic ductal dilatation or surrounding inflammatory changes. Spleen: Scattered hyperdense nodules within the spleen. Some may be calcified suggesting granuloma, others are not definitively calcified. Adrenals/Urinary Tract: Adrenal glands are normal. Kidneys show no hydronephrosis. No ureteral stone. The bladder is normal Stomach/Bowel: Stomach is within normal limits. Appendix appears normal. No evidence of bowel wall thickening, distention, or inflammatory changes. Vascular/Lymphatic: Moderate aortic atherosclerosis. No aneurysm. No suspicious nodes Reproductive: Uterus and bilateral adnexa are unremarkable. Other: Negative for free air or free fluid Musculoskeletal: Posterior fusion hardware at L2-L3 with interbody device. No acute osseous abnormality. IMPRESSION: 1. Negative for hydronephrosis or urinary tract stone. Negative for acute appendicitis. 2. Scattered hyperdense nodules in the spleen, some of these are not clearly calcified as may be seen with granuloma, in the absence of known malignancy, recommend 6 month follow-up MRI. 3. Patchy ground-glass density in the right middle lobe and lung bases suggesting respiratory infection/pneumonia, to include atypical or viral process. Electronically Signed   By: Donavan Foil M.D.   On: 05/12/2021 18:49   DG Chest Portable 1 View  Result Date: 05/12/2021 CLINICAL DATA:  Intermittent chest pain for 28 days EXAM: PORTABLE CHEST 1 VIEW COMPARISON:  04/21/2017 FINDINGS: Shallow inspiration. Heart size and pulmonary  vascularity are normal. Patchy airspace infiltrates suggested in both lower lungs. Changes likely represent multifocal pneumonia although edema could also have this appearance. No pleural effusions. No pneumothorax. Calcification of the aorta. IMPRESSION: Patchy airspace disease in the lower lungs. Electronically Signed   By: Lucienne Capers M.D.   On: 05/12/2021 18:01    Procedures Procedures   Medications Ordered in ED Medications  magnesium oxide (MAG-OX) tablet 800 mg (800 mg Oral Given 05/12/21 2229)    ED Course  I have reviewed the triage vital signs and the nursing notes.  Pertinent labs & imaging results that were available during my care of the patient were reviewed by me and considered in my medical decision making (see chart for details).  Clinical Course as of 05/13/21 0049  Thu May 12, 2021  1902 DG Chest Portable 1 View Could be representative of atypical pneumonia which would follow with her history of cough, malaise. [ZB]  1903 CT ABDOMEN PELVIS WO CONTRAST No obstructive uropathy identified.  Visualized groundglass opacities in bilateral lungs.  After further discussion with the patient, her symptoms of cough, malaise and chills were about 2 weeks ago and have since been improving and almost resolved at this point.  CT findings likely evidence of resolving pneumonia. [ZB]  2222 Comprehensive metabolic panel(!) Mild hyponatremia, unclear etiology.  Renal function at baseline. [ZB]    Clinical Course User Index [ZB] Pearson Grippe, DO   MDM Rules/Calculators/A&P HEAR Score: 3  This is a 60 year old female with history as above who presented to the emergency department with largely atypical chest pain sporadically for approximately 1 month.  No history of known coronary disease. HEAR score of 3. EKG as above reassuring without evidence of ischemia. No history of VTE.  Not tachycardic or hypoxic.  Pain is not described as pleuritic.  Low index  of suspicion for pulmonary embolism. Does report a mild nonproductive cough over the last week or so but seems to be improving.  I am also considering pneumonia.  Chest x-ray ordered. Very low index of suspicion for aortic dissection or AAA based on my exam.  As far as the right flank pain, I reviewed results of the renal ultrasound that was performed last week and it showed a possible right sided stone in the kidney. Ordered for CT scan of the abdomen to further evaluate.  ED course: Please see ED clinical course above for further medical decision making. Replaced magnesium PO in the ED. Delta trop negative. Do not feel as though she requires further workup such as admission for provocative testing at this time. On reassessment, she has eaten a sandwhich, is drinking coke and feeling much better.  She has multiple incidental abnormalities such as hypomagnesemia, hyponatremia and splenic nodules. I discussed all of these findings with her and that I am unable to tell her exactly what caused her symptoms and if they are related to the incidental findings.  However, she feels comfortable returning home and getting prompt outpatient follow up which I feel is appropriate at this time.  RX magnesium supplement for 5 days and she will need repeat labs done next week.  Strict return precautions given.  Final Clinical Impression(s) / ED Diagnoses Final diagnoses:  Atypical chest pain  Hypomagnesemia  Hyponatremia  Nodule of spleen    Rx / DC Orders ED Discharge Orders          Ordered    Magnesium Oxide 250 MG TABS  Daily       Note to Pharmacy: Any formulation is ok   05/12/21 [place], [place], DO 05/13/21 3500    Lajean Saver, MD 05/13/21 1858

## 2021-05-12 NOTE — ED Notes (Signed)
Pt ambulatory to [facility]

## 2021-05-13 LAB — RESP PANEL BY RT-PCR (FLU A&B, COVID) ARPGX2
Influenza A by PCR: NEGATIVE
Influenza B by PCR: NEGATIVE
SARS Coronavirus 2 by RT PCR: NEGATIVE

## 2021-06-28 ENCOUNTER — Ambulatory Visit
Admission: RE | Admit: 2021-06-28 | Discharge: 2021-06-28 | Disposition: A | Discharge status: Home / Self Care | Payer: Medicare Other | Source: Ambulatory Visit | Attending: Student in an Organized Health Care Education/Training Program | Admitting: Student in an Organized Health Care Education/Training Program

## 2021-06-28 ENCOUNTER — Other Ambulatory Visit: Payer: Self-pay

## 2021-06-28 DIAGNOSIS — Z1231 Encounter for screening mammogram for malignant neoplasm of breast: Secondary | ICD-10-CM

## 2021-08-03 ENCOUNTER — Other Ambulatory Visit: Payer: Self-pay | Admitting: Student in an Organized Health Care Education/Training Program

## 2021-08-03 DIAGNOSIS — R1011 Right upper quadrant pain: Secondary | ICD-10-CM

## 2021-08-03 DIAGNOSIS — R1901 Right upper quadrant abdominal swelling, mass and lump: Secondary | ICD-10-CM

## 2021-08-23 ENCOUNTER — Ambulatory Visit
Admission: RE | Admit: 2021-08-23 | Discharge: 2021-08-23 | Disposition: A | Discharge status: Home / Self Care | Payer: Medicare Other | Source: Ambulatory Visit | Attending: Student in an Organized Health Care Education/Training Program | Admitting: Student in an Organized Health Care Education/Training Program

## 2021-08-23 DIAGNOSIS — R1901 Right upper quadrant abdominal swelling, mass and lump: Secondary | ICD-10-CM

## 2021-08-23 DIAGNOSIS — R1011 Right upper quadrant pain: Secondary | ICD-10-CM

## 2022-01-25 ENCOUNTER — Other Ambulatory Visit: Payer: Self-pay | Admitting: Family Medicine

## 2022-01-25 ENCOUNTER — Ambulatory Visit
Admission: RE | Admit: 2022-01-25 | Discharge: 2022-01-25 | Disposition: A | Discharge status: Home / Self Care | Payer: Medicare HMO | Source: Ambulatory Visit | Attending: Family Medicine | Admitting: Family Medicine

## 2022-01-25 DIAGNOSIS — R0989 Other specified symptoms and signs involving the circulatory and respiratory systems: Secondary | ICD-10-CM

## 2022-01-25 DIAGNOSIS — R059 Cough, unspecified: Secondary | ICD-10-CM

## 2022-01-25 DIAGNOSIS — Z720 Tobacco use: Secondary | ICD-10-CM

## 2022-02-15 ENCOUNTER — Other Ambulatory Visit: Payer: Self-pay | Admitting: Family Medicine

## 2022-02-15 DIAGNOSIS — Z720 Tobacco use: Secondary | ICD-10-CM

## 2022-02-15 DIAGNOSIS — Z122 Encounter for screening for malignant neoplasm of respiratory organs: Secondary | ICD-10-CM

## 2022-06-09 ENCOUNTER — Other Ambulatory Visit: Payer: Self-pay | Admitting: Family Medicine

## 2022-06-09 DIAGNOSIS — Z1231 Encounter for screening mammogram for malignant neoplasm of breast: Secondary | ICD-10-CM

## 2022-06-30 ENCOUNTER — Ambulatory Visit
Admission: RE | Admit: 2022-06-30 | Discharge: 2022-06-30 | Disposition: A | Discharge status: Home / Self Care | Payer: Medicare HMO | Source: Ambulatory Visit | Attending: Family Medicine | Admitting: Family Medicine

## 2022-06-30 DIAGNOSIS — Z1231 Encounter for screening mammogram for malignant neoplasm of breast: Secondary | ICD-10-CM

## 2023-05-21 ENCOUNTER — Other Ambulatory Visit: Payer: Self-pay

## 2023-05-21 DIAGNOSIS — Z1231 Encounter for screening mammogram for malignant neoplasm of breast: Secondary | ICD-10-CM

## 2023-07-02 ENCOUNTER — Ambulatory Visit: Payer: Medicare HMO

## 2024-01-09 ENCOUNTER — Encounter: Payer: Self-pay | Admitting: Internal Medicine

## 2024-08-27 DEATH — deceased
